# Patient Record
Sex: Female | Born: 1990 | Race: White | Hispanic: No | Marital: Married | Smoking: Never smoker
Health system: Southern US, Community
[De-identification: ages and names within clinical notes are randomized; demographics above are authoritative.]

## PROBLEM LIST (undated history)

## (undated) DIAGNOSIS — Q2381 Bicuspid aortic valve: Secondary | ICD-10-CM

## (undated) DIAGNOSIS — N83299 Other ovarian cyst, unspecified side: Secondary | ICD-10-CM

## (undated) DIAGNOSIS — F419 Anxiety disorder, unspecified: Secondary | ICD-10-CM

## (undated) DIAGNOSIS — Q231 Congenital insufficiency of aortic valve: Secondary | ICD-10-CM

## (undated) DIAGNOSIS — F32A Depression, unspecified: Secondary | ICD-10-CM

## (undated) DIAGNOSIS — J45909 Unspecified asthma, uncomplicated: Secondary | ICD-10-CM

---
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
## 2020-10-03 ENCOUNTER — Emergency Department
Admission: EM | Admit: 2020-10-03 | Discharge: 2020-10-03 | Disposition: A | Discharge status: Home / Self Care | Payer: PRIVATE HEALTH INSURANCE | Attending: Emergency Medicine | Admitting: Emergency Medicine

## 2020-10-03 ENCOUNTER — Emergency Department: Payer: PRIVATE HEALTH INSURANCE

## 2020-10-03 ENCOUNTER — Other Ambulatory Visit: Payer: Self-pay

## 2020-10-03 ENCOUNTER — Encounter: Payer: Self-pay | Admitting: Intensive Care

## 2020-10-03 DIAGNOSIS — O219 Vomiting of pregnancy, unspecified: Secondary | ICD-10-CM | POA: Diagnosis present

## 2020-10-03 DIAGNOSIS — N83299 Other ovarian cyst, unspecified side: Secondary | ICD-10-CM | POA: Diagnosis not present

## 2020-10-03 DIAGNOSIS — R000 Tachycardia, unspecified: Secondary | ICD-10-CM | POA: Insufficient documentation

## 2020-10-03 DIAGNOSIS — Z20822 Contact with and (suspected) exposure to covid-19: Secondary | ICD-10-CM | POA: Diagnosis not present

## 2020-10-03 DIAGNOSIS — R197 Diarrhea, unspecified: Secondary | ICD-10-CM

## 2020-10-03 DIAGNOSIS — Z3A01 Less than 8 weeks gestation of pregnancy: Secondary | ICD-10-CM | POA: Insufficient documentation

## 2020-10-03 DIAGNOSIS — O3481 Maternal care for other abnormalities of pelvic organs, first trimester: Secondary | ICD-10-CM | POA: Insufficient documentation

## 2020-10-03 DIAGNOSIS — N3090 Cystitis, unspecified without hematuria: Secondary | ICD-10-CM | POA: Diagnosis not present

## 2020-10-03 DIAGNOSIS — R110 Nausea: Secondary | ICD-10-CM

## 2020-10-03 DIAGNOSIS — R112 Nausea with vomiting, unspecified: Secondary | ICD-10-CM

## 2020-10-03 DIAGNOSIS — R1030 Lower abdominal pain, unspecified: Secondary | ICD-10-CM

## 2020-10-03 DIAGNOSIS — O99891 Other specified diseases and conditions complicating pregnancy: Secondary | ICD-10-CM | POA: Insufficient documentation

## 2020-10-03 DIAGNOSIS — O2311 Infections of bladder in pregnancy, first trimester: Secondary | ICD-10-CM | POA: Insufficient documentation

## 2020-10-03 DIAGNOSIS — E860 Dehydration: Secondary | ICD-10-CM

## 2020-10-03 LAB — URINALYSIS, COMPLETE (UACMP) WITH MICROSCOPIC
Bilirubin Urine: NEGATIVE
Glucose, UA: NEGATIVE mg/dL
Hgb urine dipstick: NEGATIVE
Ketones, ur: 80 mg/dL — AB
Leukocytes,Ua: NEGATIVE
Nitrite: NEGATIVE
Protein, ur: 30 mg/dL — AB
Specific Gravity, Urine: 1.03 (ref 1.005–1.030)
pH: 6 (ref 5.0–8.0)

## 2020-10-03 LAB — CBC
HCT: 38 % (ref 36.0–46.0)
Hemoglobin: 13.1 g/dL (ref 12.0–15.0)
MCH: 31 pg (ref 26.0–34.0)
MCHC: 34.5 g/dL (ref 30.0–36.0)
MCV: 90 fL (ref 80.0–100.0)
Platelets: 212 10*3/uL (ref 150–400)
RBC: 4.22 MIL/uL (ref 3.87–5.11)
RDW: 14.1 % (ref 11.5–15.5)
WBC: 7.5 10*3/uL (ref 4.0–10.5)
nRBC: 0 % (ref 0.0–0.2)

## 2020-10-03 LAB — COMPREHENSIVE METABOLIC PANEL
ALT: 14 U/L (ref 0–44)
AST: 21 U/L (ref 15–41)
Albumin: 4.5 g/dL (ref 3.5–5.0)
Alkaline Phosphatase: 27 U/L — ABNORMAL LOW (ref 38–126)
Anion gap: 10 (ref 5–15)
BUN: 15 mg/dL (ref 6–20)
CO2: 20 mmol/L — ABNORMAL LOW (ref 22–32)
Calcium: 9.4 mg/dL (ref 8.9–10.3)
Chloride: 106 mmol/L (ref 98–111)
Creatinine, Ser: 0.55 mg/dL (ref 0.44–1.00)
GFR, Estimated: >60 mL/min (ref >60)
Glucose, Bld: 135 mg/dL — ABNORMAL HIGH (ref 70–99)
Potassium: 3.9 mmol/L (ref 3.5–5.1)
Sodium: 136 mmol/L (ref 135–145)
Total Bilirubin: 1.3 mg/dL — ABNORMAL HIGH (ref 0.3–1.2)
Total Protein: 7.3 g/dL (ref 6.5–8.1)

## 2020-10-03 LAB — MAGNESIUM: Magnesium: 1.8 mg/dL (ref 1.7–2.4)

## 2020-10-03 LAB — LIPASE, BLOOD: Lipase: 27 U/L (ref 11–51)

## 2020-10-03 LAB — RESP PANEL BY RT-PCR (FLU A&B, COVID) ARPGX2
Influenza A by PCR: NEGATIVE
Influenza B by PCR: NEGATIVE
SARS Coronavirus 2 by RT PCR: NEGATIVE

## 2020-10-03 LAB — HCG, QUANTITATIVE, PREGNANCY: hCG, Beta Chain, Quant, S: 34833 m[IU]/mL — ABNORMAL HIGH (ref <5)

## 2020-10-03 IMAGING — US US OB COMP LESS 14 WK
1 series · 13 of 28 positions shown · non-contrast
Comparison: None.

CLINICAL DATA: Nausea and vomiting. Pregnant patient. Patient
states known bilateral dermoids.

EXAM:
OBSTETRIC <14 WK ULTRASOUND
TECHNIQUE: Transabdominal ultrasound was performed for evaluation of the
gestation as well as the maternal uterus and adnexal regions.

[Series 1: us ob less than 14 weeks with ob transvaginal · 67 acquisitions, 13 frames shown]
[im 3/67]
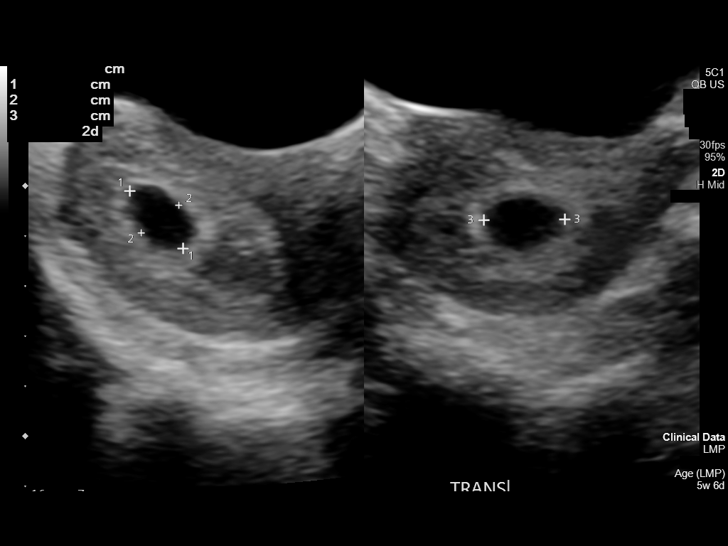
[im 8/67]
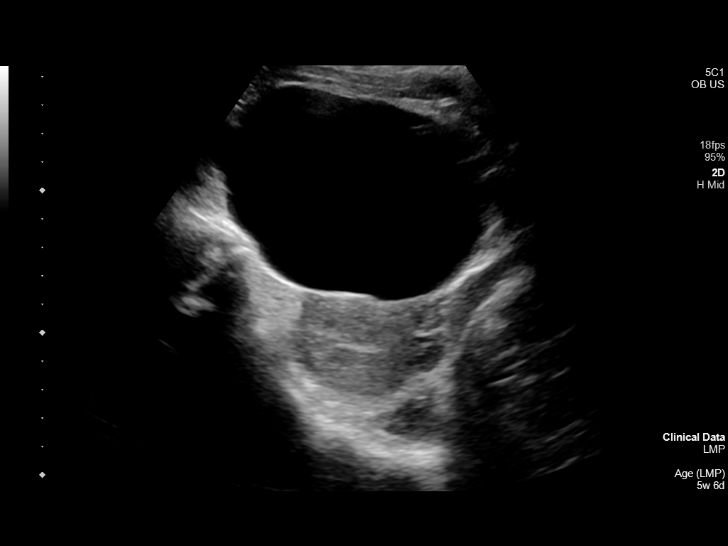
[im 13/67]
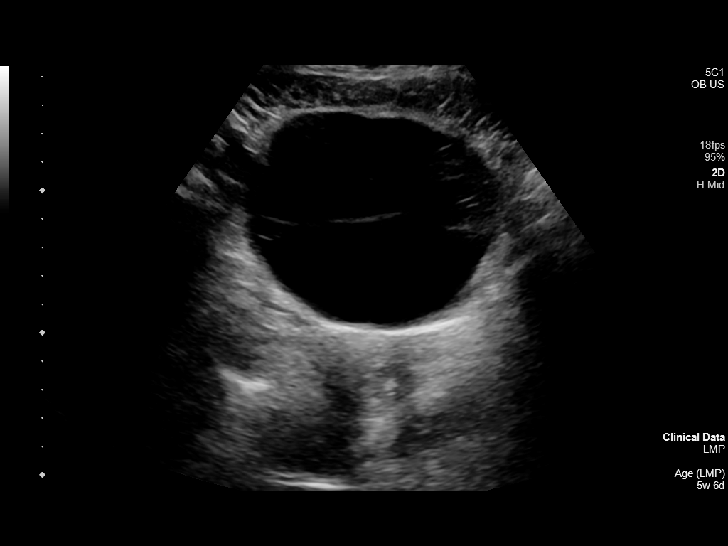
[im 18/67]
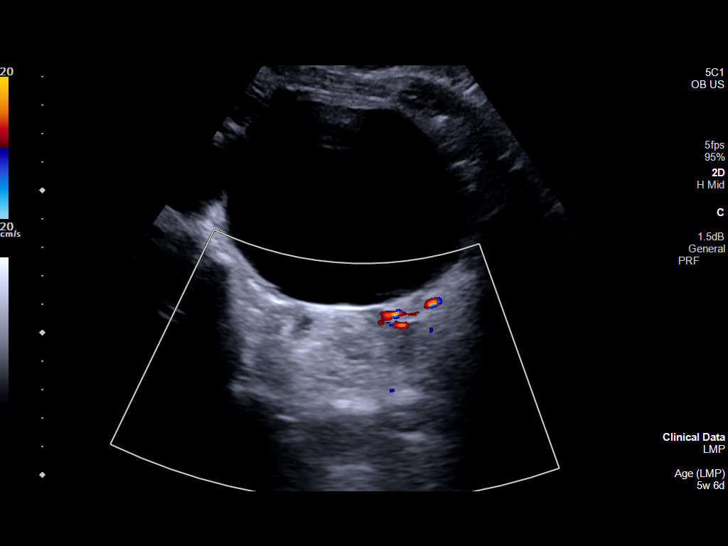
[im 23/67]
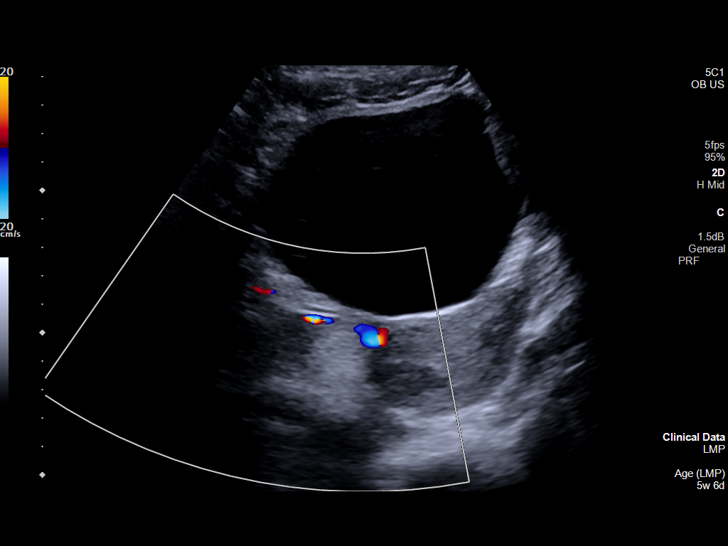
[im 27/67]
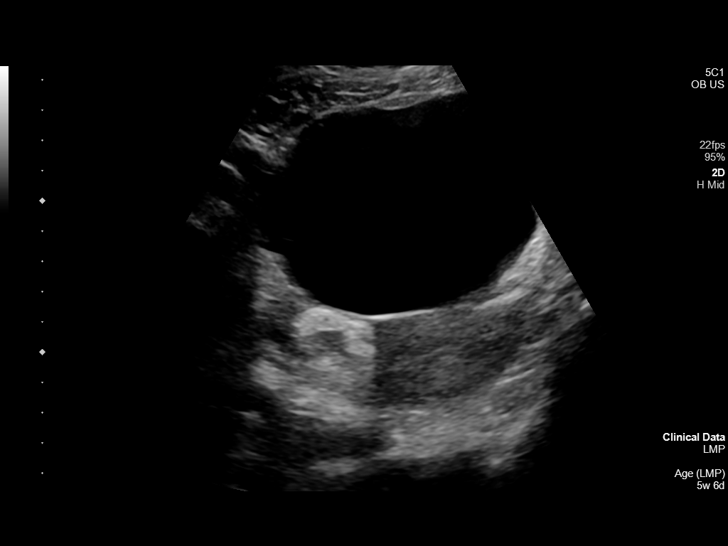
[im 35/67]
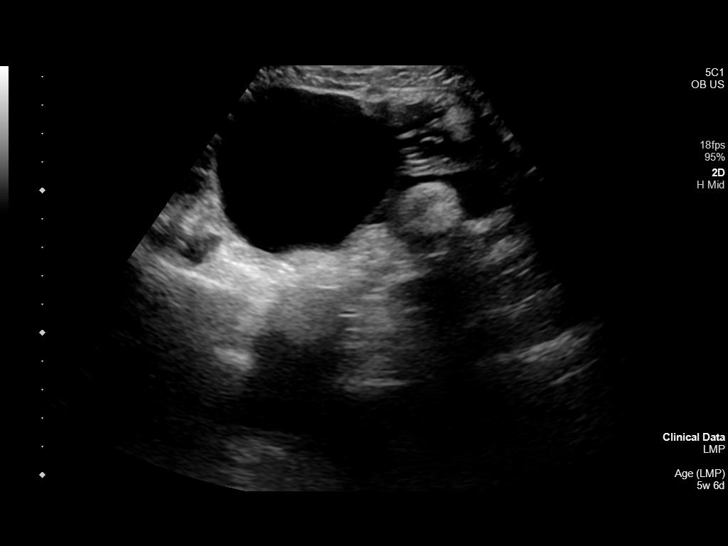
[im 40/67]
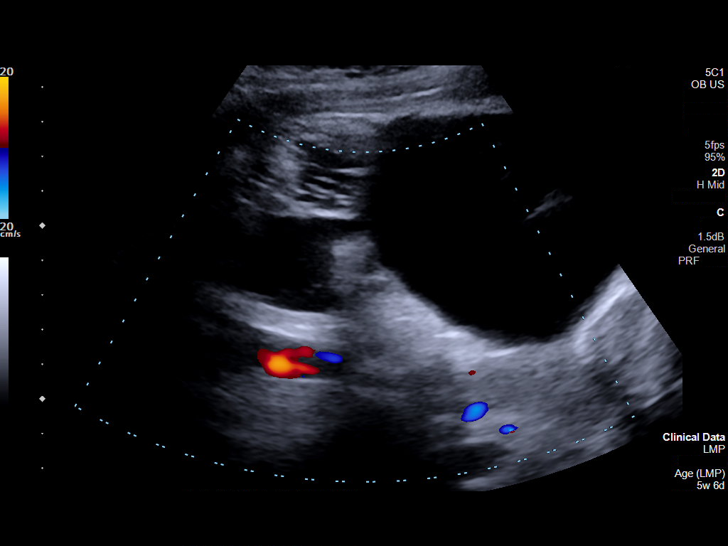
[im 45/67]
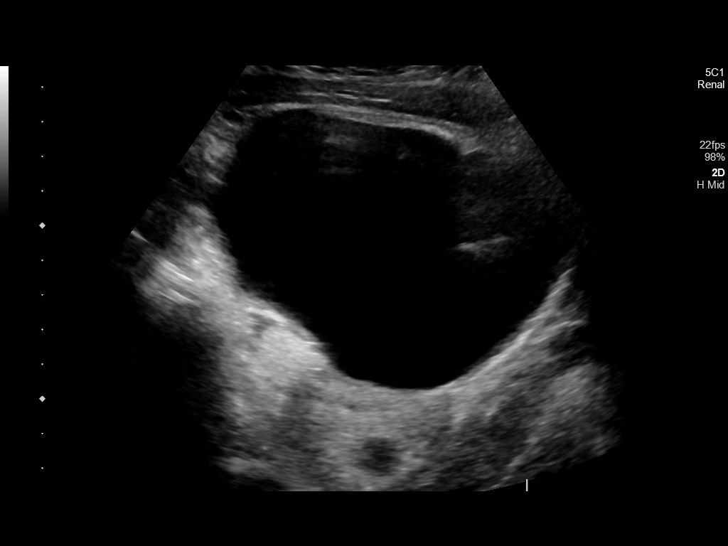
[im 49/67]
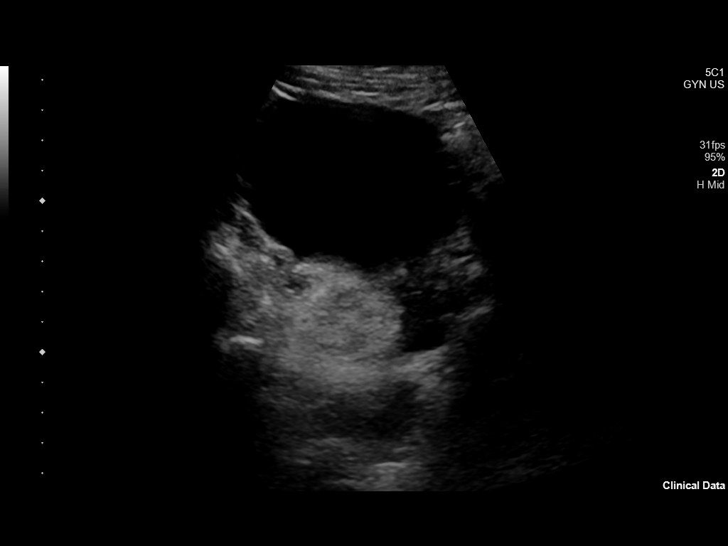
[im 54/67]
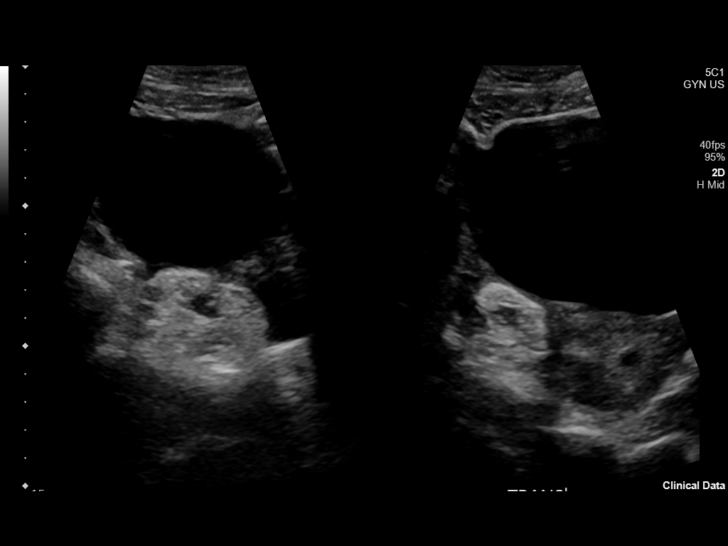
[im 59/67]
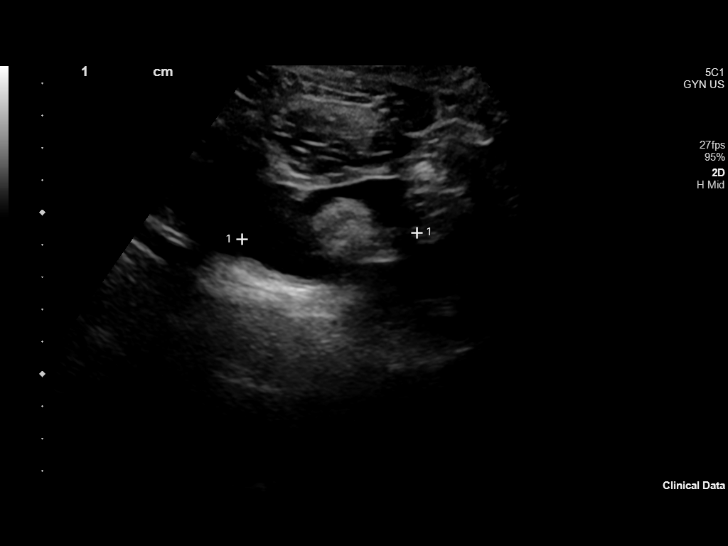
[im 64/67]
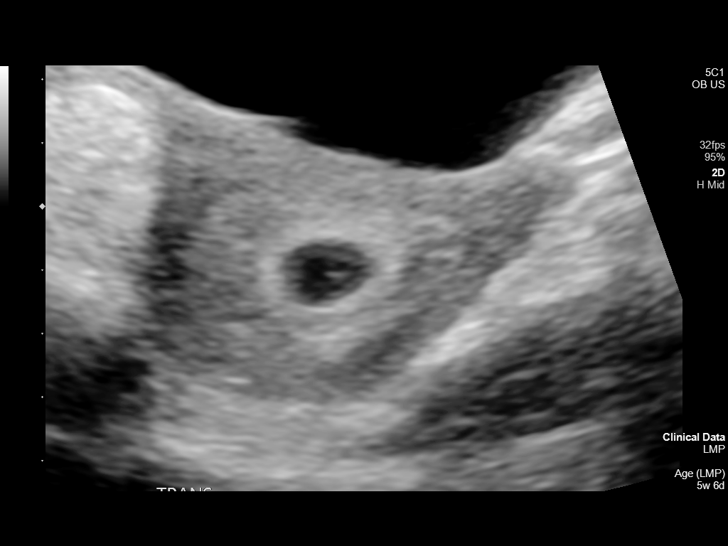

[13 of 28 positions shown; findings below may reference images not displayed]

FINDINGS: Intrauterine gestational sac: Single

Yolk sac:  Visualized.

Embryo:  Not Visualized.

Cardiac Activity: Not Visualized.

MSD:  13.7 mm   6 w   2 d

Subchorionic hemorrhage:  Small

Maternal uterus/adnexae: Bilateral complex adnexal masses. The right
adnexal mass measures 6.4 x 5.3 x 4.5 cm. The hyperechoic portion of
this mass measures 4.8 x 3.9 x 3.7 cm. The left adnexal mass
measures 7.8 x 3.4 x 5.4 cm. The hyperechoic portion measures 2.3 x
2.3 x 3.1 cm. Trace physiologic fluid in the pelvis.

Other: The patient was unable to empty or bladder with a volume of
391 cc.
IMPRESSION: 1. Intrauterine pregnancy with a gestational sac and yolk sac. No
fetal pole seen at this time.
2. Bilateral complex adnexal masses, both of which have hyperechoic
components. There are no previous studies for comparison but the
patient does state she has known bilateral dermoids which would
explain these masses.

## 2020-10-03 MED ORDER — LACTATED RINGERS IV BOLUS
2000.0000 mL | Freq: Once | INTRAVENOUS | Status: AC
  Administered 2020-10-03: 2000 mL via INTRAVENOUS

## 2020-10-03 MED ORDER — DOXYLAMINE-PYRIDOXINE 10-10 MG PO TBEC: 1.0000 | DELAYED_RELEASE_TABLET | Freq: Two times a day (BID) | ORAL | 0 refills | Status: AC | PRN

## 2020-10-03 MED ORDER — DOXYLAMINE-PYRIDOXINE 10-10 MG PO TBEC: 1.0000 | DELAYED_RELEASE_TABLET | Freq: Two times a day (BID) | ORAL | 0 refills | Status: DC | PRN

## 2020-10-03 MED ORDER — CEPHALEXIN 500 MG PO CAPS: 500.0000 mg | ORAL_CAPSULE | Freq: Four times a day (QID) | ORAL | 0 refills | Status: AC

## 2020-10-03 MED ORDER — PYRIDOXINE HCL 100 MG/ML IJ SOLN
100.0000 mg | Freq: Once | INTRAMUSCULAR | Status: AC
  Administered 2020-10-03: 100 mg via INTRAVENOUS
  Filled 2020-10-03: qty 1

## 2020-10-03 MED ORDER — DOXYLAMINE SUCCINATE (SLEEP) 25 MG PO TABS
25.0000 mg | ORAL_TABLET | ORAL | Status: AC
  Administered 2020-10-03: 25 mg via ORAL
  Filled 2020-10-03: qty 1

## 2020-10-03 NOTE — ED Notes (Signed)
Patient provided with ice water and crackers for PO challenge.

## 2020-10-03 NOTE — ED Provider Notes (Signed)
[facility] Emergency Department Provider Note  ____________________________________________   Event Date/Time   First MD Initiated Contact with Patient 10/03/20 1003     (approximate)  I have reviewed the triage vital signs and the nursing notes.   HISTORY  Chief Complaint Emesis During Pregnancy   HPI Carolyn Munoz is a 30 y.o. female G1, P0 currently 6 weeks by last LMP with has no past medical history presents for assessment of some nonbloody emesis vomiting diarrhea that started last night.  Patient states he felt little nauseous day before.  He states he has a little bit of crampy abdominal pain.  No headache, earache, sore throat, chest pain, cough, shortness of breath, back pain, rash or extremity pain.  He thinks he may have had a little burning with urination today but is not sure.  No abnormal bleeding or discharge.  No recent injuries or falls.  She is only on a prenatal.  No recent EtOH, illicit drug use, or tobacco abuse.  She has not taken anything for her symptoms and has not yet seen OB for this pregnancy.         History reviewed. No pertinent past medical history.  There are no problems to display for this patient.   History reviewed. No pertinent surgical history.  Prior to Admission medications   Medication Sig Start Date End Date Taking? Authorizing Provider  cephALEXin (KEFLEX) 500 MG capsule Take 1 capsule (500 mg total) by mouth 4 (four) times daily for 5 days. 10/03/20 10/08/20 Yes Lucrezia Starch, MD  Doxylamine-Pyridoxine (DICLEGIS) 10-10 MG TBEC Take 1 tablet by mouth 2 (two) times daily as needed for up to 10 days. 10/03/20 10/13/20  Lucrezia Starch, MD    Allergies Patient has no known allergies.  History reviewed. No pertinent family history.  Social History Social History   Tobacco Use  . Smoking status: Never Smoker  . Smokeless tobacco: Never Used  Substance Use Topics  . Alcohol use: Not Currently  . Drug use:  Never    Review of Systems  Review of Systems  Constitutional: Negative for chills and fever.  HENT: Negative for sore throat.   Eyes: Negative for pain.  Respiratory: Negative for cough and stridor.   Cardiovascular: Negative for chest pain.  Gastrointestinal: Positive for abdominal pain, diarrhea, nausea and vomiting.  Genitourinary: Positive for dysuria.  Musculoskeletal: Negative for myalgias.  Skin: Negative for rash.  Neurological: Negative for seizures, loss of consciousness and headaches.  Psychiatric/Behavioral: Negative for suicidal ideas.  All other systems reviewed and are negative.     ____________________________________________   PHYSICAL EXAM:  VITAL SIGNS: ED Triage Vitals  Enc Vitals Group     BP 10/03/20 0732 105/72     Pulse Rate 10/03/20 0732 (!) 120     Resp 10/03/20 0732 20     Temp 10/03/20 0732 98.6 F (37 C)     Temp Source 10/03/20 0732 Oral     SpO2 10/03/20 0732 99 %     Weight 10/03/20 0722 130 lb (59 kg)     Height 10/03/20 0722 5' (1.524 m)     Head Circumference --      Peak Flow --      Pain Score 10/03/20 0722 7     Pain Loc --      Pain Edu? --      Excl. in [place]? --    Vitals:   10/03/20 0732  BP: 105/72  Pulse: Marland Kitchen)  120  Resp: 20  Temp: 98.6 F (37 C)  SpO2: 99%   Physical Exam Vitals and nursing note reviewed.  Constitutional:      General: She is not in acute distress.    Appearance: She is well-developed.  HENT:     Head: Normocephalic and atraumatic.     Right Ear: External ear normal.     Left Ear: External ear normal.     Nose: Nose normal.     Mouth/Throat:     Mouth: Mucous membranes are dry.  Eyes:     Conjunctiva/sclera: Conjunctivae normal.  Cardiovascular:     Rate and Rhythm: Regular rhythm. Tachycardia present.     Heart sounds: No murmur heard.   Pulmonary:     Effort: Pulmonary effort is normal. No respiratory distress.     Breath sounds: Normal breath sounds.  Abdominal:     Palpations:  Abdomen is soft.     Tenderness: There is no abdominal tenderness. There is no right CVA tenderness or left CVA tenderness.  Musculoskeletal:     Cervical back: Neck supple.  Skin:    General: Skin is warm and dry.     Capillary Refill: Capillary refill takes 2 to 3 seconds.  Neurological:     Mental Status: She is alert and oriented to person, place, and time.  Psychiatric:        Mood and Affect: Mood normal.     Abdomen soft nontender throughout and there is no CVA tenderness. ____________________________________________   LABS (all labs ordered are listed, but only abnormal results are displayed)  Labs Reviewed  COMPREHENSIVE METABOLIC PANEL - Abnormal; Notable for the following components:      Result Value   CO2 20 (*)    Glucose, Bld 135 (*)    Alkaline Phosphatase 27 (*)    Total Bilirubin 1.3 (*)    All other components within normal limits  URINALYSIS, COMPLETE (UACMP) WITH MICROSCOPIC - Abnormal; Notable for the following components:   Color, Urine YELLOW (*)    APPearance HAZY (*)    Ketones, ur 80 (*)    Protein, ur 30 (*)    Bacteria, UA RARE (*)    All other components within normal limits  HCG, QUANTITATIVE, PREGNANCY - Abnormal; Notable for the following components:   hCG, Beta Chain, Quant, [place] 34,833 (*)    All other components within normal limits  RESP PANEL BY RT-PCR (FLU A&B, COVID) ARPGX2  URINE CULTURE  LIPASE, BLOOD  CBC  MAGNESIUM   ____________________________________________  EKG  ____________________________________________  RADIOLOGY  ED MD interpretation: Intrauterine pregnancy with gestational sac and yolk sac but no clear fetal pole seen at this time likely due to age.  Has bilateral adnexal masses consistent with patient's known history of dermoid cyst.  Official radiology report(s): US OB Comp Less 14 Wks  Result Date: 10/03/2020 CLINICAL DATA:  Nausea and vomiting. Pregnant patient. 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  EXAM: OBSTETRIC <14 WK ULTRASOUND TECHNIQUE: Transabdominal ultrasound was performed for evaluation of the gestation as well as the maternal uterus and adnexal regions. COMPARISON:  None. FINDINGS: Intrauterine gestational sac: Single Yolk sac:  Visualized. Embryo:  Not Visualized. Cardiac Activity: Not Visualized. MSD:  13.7 mm   6 w   2 d Subchorionic hemorrhage:  Small Maternal uterus/adnexae: Bilateral complex adnexal masses. The right adnexal mass measures 6.4 x 5.3 x 4.5 cm. The hyperechoic portion of this mass measures 4.8 x 3.9 x 3.7 cm. The left adnexal  mass measures 7.8 x 3.4 x 5.4 cm. The hyperechoic portion measures 2.3 x 2.3 x 3.1 cm. Trace physiologic fluid in the pelvis. Other: The patient was unable to empty or bladder with a volume of 391 cc. IMPRESSION: 1. Intrauterine pregnancy with a gestational sac and yolk sac. No fetal pole seen at this time. 2. Bilateral complex adnexal masses, both of which have hyperechoic components. There are no previous studies for comparison but the patient does state she has known bilateral dermoids which would explain these masses. Electronically Signed   By: Dorise Bullion III M.D   On: 10/03/2020 11:49    ____________________________________________   PROCEDURES  Procedure(s) performed (including Critical Care):  .1-3 Lead EKG Interpretation Performed by: Lucrezia Starch, MD Authorized by: Lucrezia Starch, MD     Interpretation: normal     ECG rate assessment: normal     Rhythm: sinus rhythm     Ectopy: none     Conduction: normal       ____________________________________________   INITIAL IMPRESSION / ASSESSMENT AND PLAN / ED COURSE      Patient presents with above-stated exam for assessment of some nonbloody nonbilious vomiting diarrhea and severe abdominal pain described above in the setting of approximately 6 weeks being pregnant.  On arrival she is tachycardic with heart rate of 120 with otherwise stable vital signs on room  air.  She endorses a crampy abdominal discomfort but her abdomen is soft nontender throughout she has no CVA tenderness.  Differential includes possible hyperemesis related to pregnancy versus gastroenteritis versus metabolic derangements.  Patient has no imaging to confirm IUP and given she reports abdominal pain will obtain ultrasound.  Low suspicion for appendicitis, torsion, PID given absence of any significant tenderness on exam of fever leukocytosis or abnormal vaginal discharge.  CMP remarkable for no significant electrolyte or metabolic derangements.  Lipase not consistent acute pancreatitis.  CBC shows no leukocytosis or acute anemia.  hCG is appropriate for expected gestational age at [id number].  Initial plan is IV fluids and antiemetics pending ultrasound.  We will also send COVID and flu  Ultrasound shows known dermoid cyst but no other acute abnormalities.  No evidence of ectopic.  Who has some bacteria which we will treat with a short course of Keflex.  Urine culture sent.  COVID and flu is negative.  Following fluids and antiemetics patient is able tolerate p.o. and feels better.  She also improvement in her heart rate.  Given stable vitals with laceration exam work-up patient tolerating p.o. I think she is safe for discharge with outpatient OB follow-up.  Rx written for Keflex and Diclegis.  Discharged stable condition.  Strict term cautions advised and discussed.      ____________________________________________   FINAL CLINICAL IMPRESSION(S) / ED DIAGNOSES  Final diagnoses:  Nausea vomiting and diarrhea  Cystitis    Medications  lactated ringers bolus 2,000 mL (2,000 mLs Intravenous New Bag/Given 10/03/20 1016)  pyridOXINE (B-6) injection 100 mg (100 mg Intravenous Given 10/03/20 1113)  doxylamine (Sleep) (UNISOM) tablet 25 mg (25 mg Oral Given 10/03/20 1112)     ED Discharge Orders         Ordered    Doxylamine-Pyridoxine (DICLEGIS) 10-10 MG TBEC  2 times daily PRN,    Status:  Discontinued        10/03/20 1107    cephALEXin (KEFLEX) 500 MG capsule  4 times daily        10/03/20 1231    Doxylamine-Pyridoxine (DICLEGIS)  10-10 MG TBEC  2 times daily PRN        10/03/20 1231           Note:  This document was prepared using Dragon voice recognition software and may include unintentional dictation errors.   Lucrezia Starch, MD 10/03/20 [phone]

## 2020-10-03 NOTE — ED Triage Notes (Signed)
Patient reports being [redacted] weeks pregnant. Nausea/vomiting started last night and unable to keep anything down. Also reports diarrhea. Denies fever. Denies discharge.

## 2020-10-05 LAB — URINE CULTURE

## 2020-11-30 LAB — OB RESULTS CONSOLE RUBELLA ANTIBODY, IGM: Rubella: IMMUNE

## 2020-11-30 LAB — OB RESULTS CONSOLE VARICELLA ZOSTER ANTIBODY, IGG: Varicella: IMMUNE

## 2020-11-30 LAB — OB RESULTS CONSOLE HEPATITIS B SURFACE ANTIGEN: Hepatitis B Surface Ag: NEGATIVE

## 2020-12-15 ENCOUNTER — Other Ambulatory Visit: Payer: Self-pay | Admitting: Obstetrics and Gynecology

## 2020-12-15 ENCOUNTER — Other Ambulatory Visit
Admission: RE | Admit: 2020-12-15 | Discharge: 2020-12-15 | Disposition: A | Discharge status: Home / Self Care | Payer: No Typology Code available for payment source | Source: Ambulatory Visit | Attending: Obstetrics and Gynecology | Admitting: Obstetrics and Gynecology

## 2020-12-15 ENCOUNTER — Other Ambulatory Visit: Payer: Self-pay

## 2020-12-15 ENCOUNTER — Other Ambulatory Visit (HOSPITAL_COMMUNITY): Payer: Self-pay | Admitting: Obstetrics and Gynecology

## 2020-12-15 ENCOUNTER — Ambulatory Visit
Admission: RE | Admit: 2020-12-15 | Discharge: 2020-12-15 | Disposition: A | Discharge status: Home / Self Care | Payer: No Typology Code available for payment source | Source: Ambulatory Visit | Attending: Obstetrics and Gynecology | Admitting: Obstetrics and Gynecology

## 2020-12-15 ENCOUNTER — Encounter
Admission: RE | Admit: 2020-12-15 | Discharge: 2020-12-15 | Disposition: A | Discharge status: Home / Self Care | Payer: No Typology Code available for payment source | Source: Ambulatory Visit | Attending: Obstetrics and Gynecology | Admitting: Obstetrics and Gynecology

## 2020-12-15 DIAGNOSIS — N9489 Other specified conditions associated with female genital organs and menstrual cycle: Secondary | ICD-10-CM

## 2020-12-15 DIAGNOSIS — Z20822 Contact with and (suspected) exposure to covid-19: Secondary | ICD-10-CM | POA: Insufficient documentation

## 2020-12-15 DIAGNOSIS — Z01812 Encounter for preprocedural laboratory examination: Secondary | ICD-10-CM | POA: Insufficient documentation

## 2020-12-15 HISTORY — DX: Unspecified asthma, uncomplicated: J45.909

## 2020-12-15 HISTORY — DX: Anxiety disorder, unspecified: F41.9

## 2020-12-15 HISTORY — DX: Bicuspid aortic valve: Q23.81

## 2020-12-15 HISTORY — DX: Congenital insufficiency of aortic valve: Q23.1

## 2020-12-15 HISTORY — DX: Other ovarian cyst, unspecified side: N83.299

## 2020-12-15 HISTORY — DX: Depression, unspecified: F32.A

## 2020-12-15 LAB — BASIC METABOLIC PANEL
Anion gap: 6 (ref 5–15)
BUN: 11 mg/dL (ref 6–20)
CO2: 23 mmol/L (ref 22–32)
Calcium: 9.4 mg/dL (ref 8.9–10.3)
Chloride: 105 mmol/L (ref 98–111)
Creatinine, Ser: <0.3 mg/dL — ABNORMAL LOW (ref 0.44–1.00)
Glucose, Bld: 91 mg/dL (ref 70–99)
Potassium: 3.8 mmol/L (ref 3.5–5.1)
Sodium: 134 mmol/L — ABNORMAL LOW (ref 135–145)

## 2020-12-15 LAB — CBC
HCT: 32.7 % — ABNORMAL LOW (ref 36.0–46.0)
Hemoglobin: 11.2 g/dL — ABNORMAL LOW (ref 12.0–15.0)
MCH: 31.8 pg (ref 26.0–34.0)
MCHC: 34.3 g/dL (ref 30.0–36.0)
MCV: 92.9 fL (ref 80.0–100.0)
Platelets: 199 10*3/uL (ref 150–400)
RBC: 3.52 MIL/uL — ABNORMAL LOW (ref 3.87–5.11)
RDW: 14.1 % (ref 11.5–15.5)
WBC: 7.5 10*3/uL (ref 4.0–10.5)
nRBC: 0 % (ref 0.0–0.2)

## 2020-12-15 LAB — TYPE AND SCREEN
ABO/RH(D): A POS
Antibody Screen: NEGATIVE
Extend sample reason: UNDETERMINED

## 2020-12-15 LAB — SARS CORONAVIRUS 2 (TAT 6-24 HRS): SARS Coronavirus 2: NEGATIVE

## 2020-12-15 IMAGING — MR MR PELVIS W/O CM
10 of 13 series · 29 of 48 positions shown · non-contrast
Comparison: Comparison with reports from the current nodal clinic
from [DATE].

CLINICAL DATA: Sixteen weeks pregnant with LEFT-sided pressure and
abnormal sonogram showing large "cysts"

EXAM:
MRI PELVIS WITHOUT CONTRAST
TECHNIQUE: Multiplanar multisequence MR imaging of the pelvis was performed. No
intravenous contrast was administered.

[Series 2: T2 · coronal · 5.0mm · 1.56mm/px · 1 of 30 slices shown (1 of 7)]
[im 1/30]
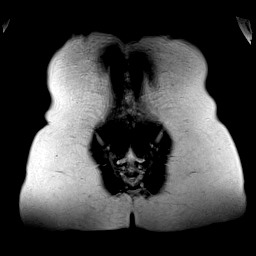

[Series 3: T2 · sagittal · 5.0mm · 0.94mm/px · 2 of 40 slices shown (2 of 7)]
[im 1/40]
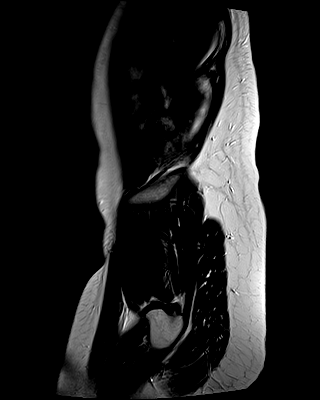
[im 40/40]
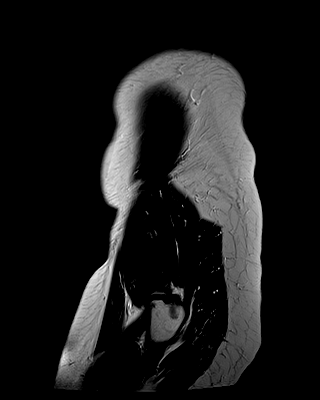

[Series 4: T2 · axial · 6.0mm · 0.59mm/px · z∈[-155,+162]mm · 3 of 45 slices shown (3 of 7)]
[im 1/45]
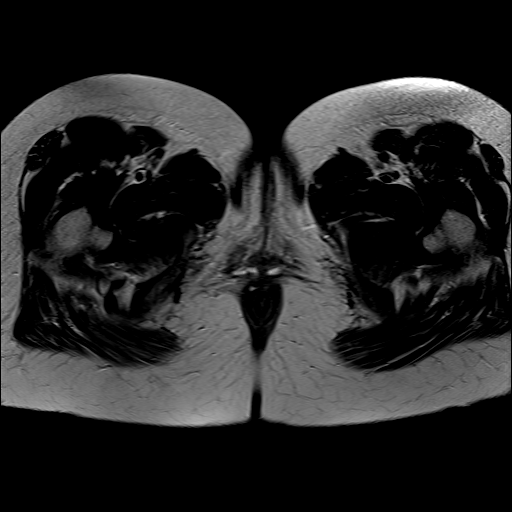
[im 23/45]
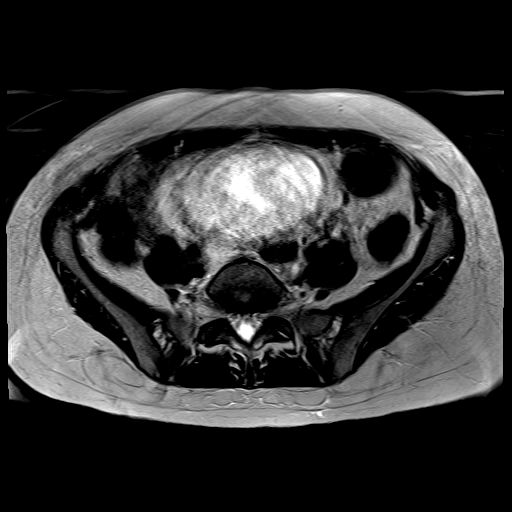
[im 45/45]
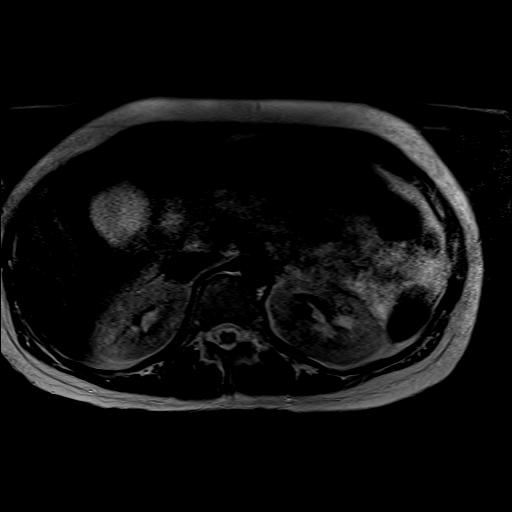

[Series 6: T2 · axial · 6.0mm · 0.59mm/px · z∈[-155,+162]mm · 3 of 45 slices shown (4 of 7)]
[im 1/45]
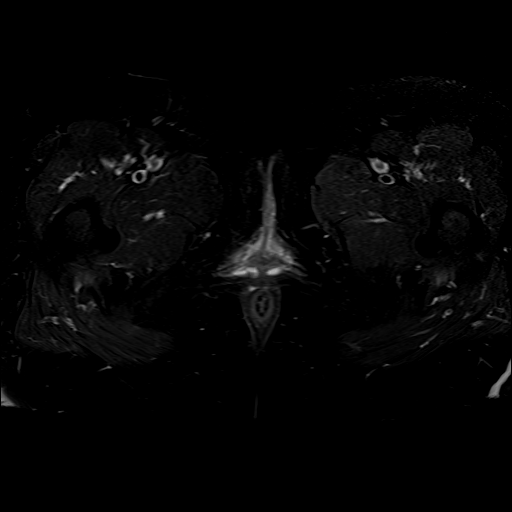
[im 23/45]
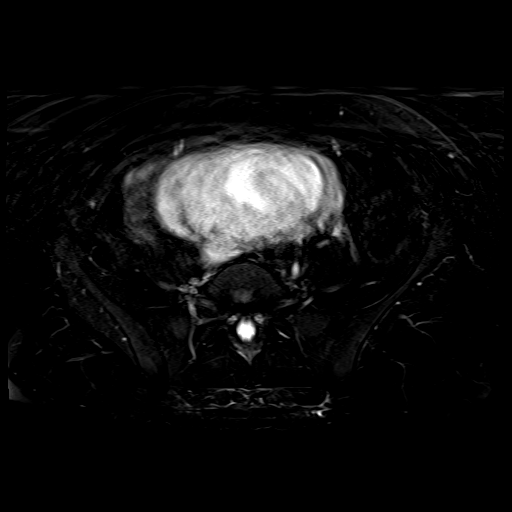
[im 45/45]
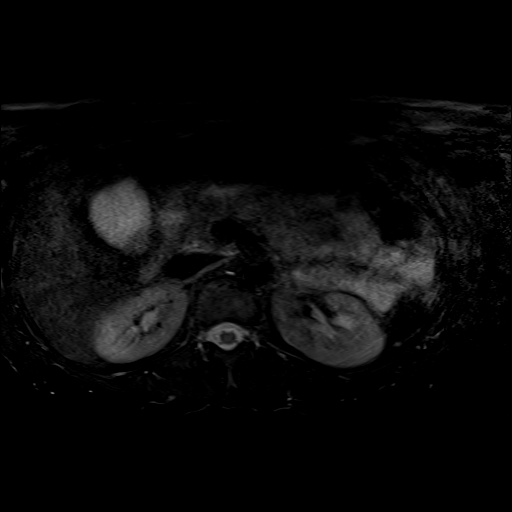

[Series 7: T2 · coronal · 5.0mm · 1.25mm/px · 2 of 33 slices shown (5 of 7)]
[im 1/33]
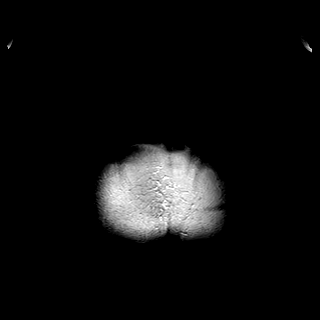
[im 33/33]
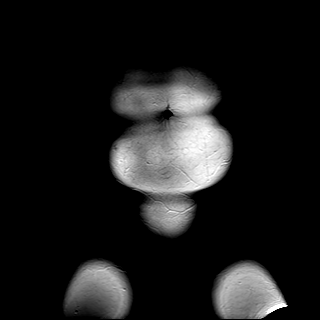

[Series 8: ax dwi_tracew · axial · 6.0mm · 1.42mm/px · z∈[-158,+166]mm · 8 of 138 slices shown]
[im 1/138]
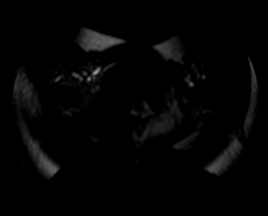
[im 20/138]
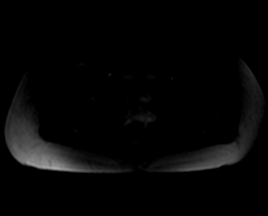
[im 40/138]
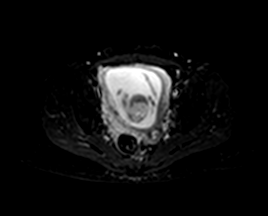
[im 59/138]
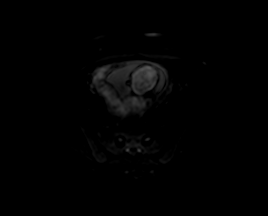
[im 79/138]
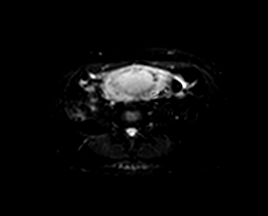
[im 98/138]
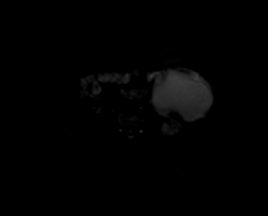
[im 118/138]
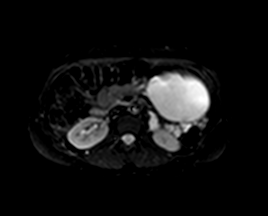
[im 138/138]
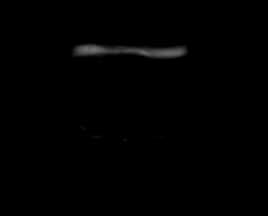

[Series 9: ax dwi_adc · axial · 6.0mm · 1.42mm/px · z∈[-158,+166]mm · 3 of 46 slices shown]
[im 1/46]
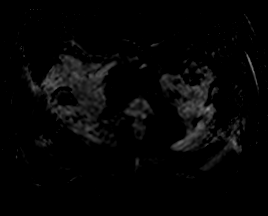
[im 23/46]
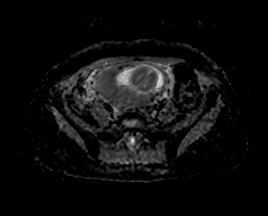
[im 46/46]
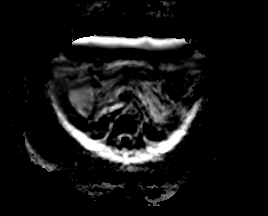

[Series 14: T2 · sagittal · 5.0mm · 0.94mm/px · 2 of 40 slices shown (6 of 7)]
[im 1/40]
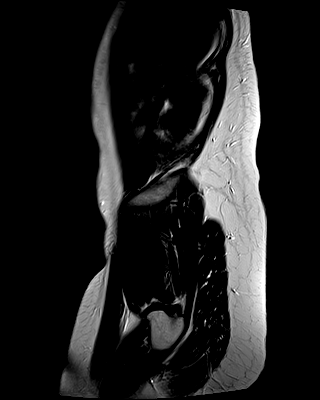
[im 40/40]
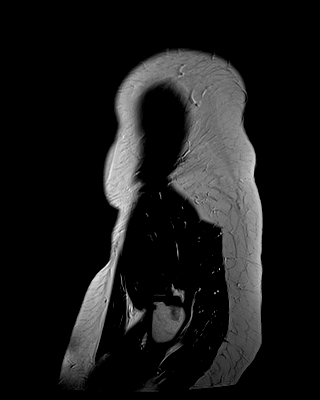

[Series 15: T2 · axial · 6.0mm · 0.66mm/px · z∈[-155,+162]mm · 3 of 45 slices shown (7 of 7)]
[im 1/45]
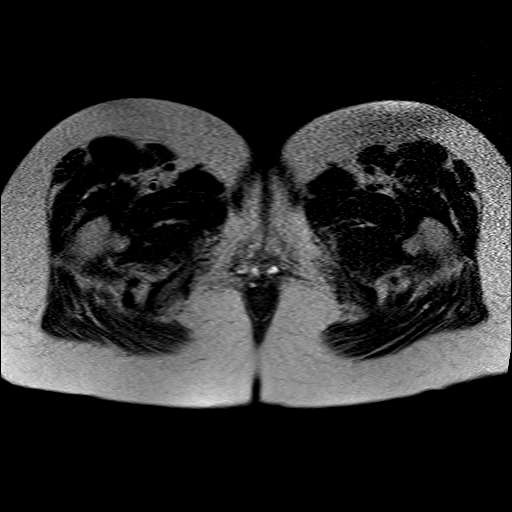
[im 23/45]
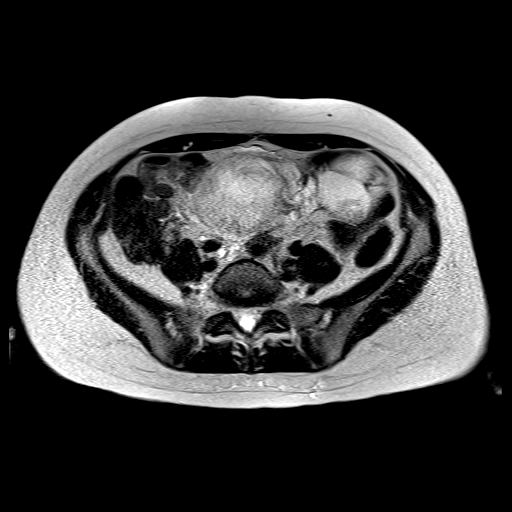
[im 45/45]
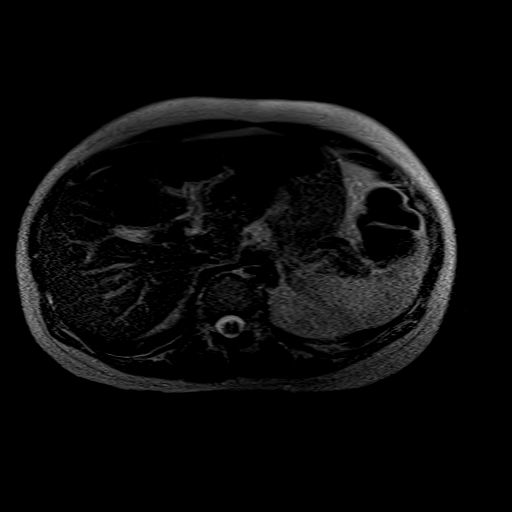

[Series 16: T2 fat-sat · axial · 6.0mm · 0.66mm/px · z∈[-155,+4]mm · 2 of 45 slices shown]
[im 1/45]
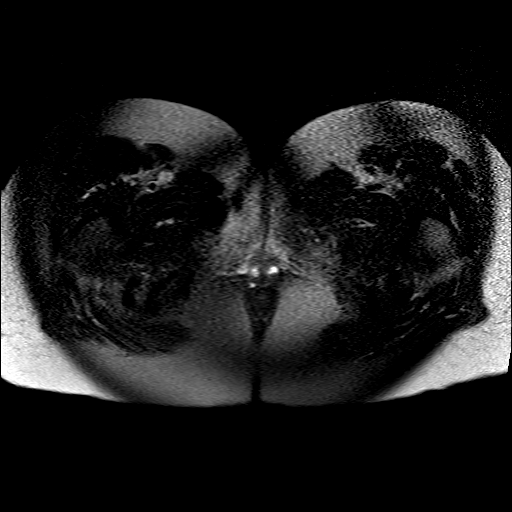
[im 23/45]
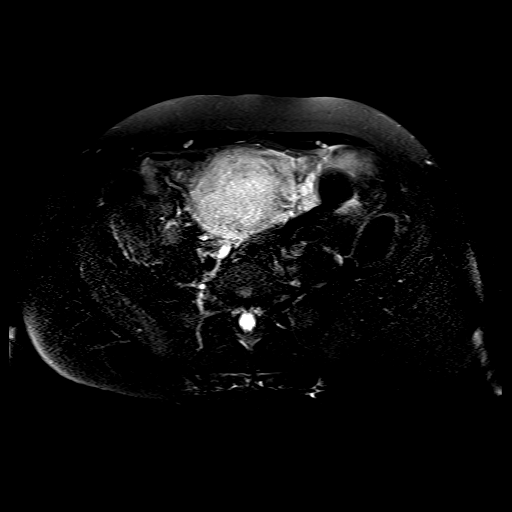

[29 of 48 positions shown; findings below may reference images not displayed]

FINDINGS: Urinary Tract: Urinary bladder is collapsed, compressed by gravid
uterus. No signs of hydronephrosis on incidentally imaged portions
of the kidneys. No distal ureteral dilation.

Bowel: Bowel displaced by cystic mass in the LEFT hemiabdomen. No
acute bowel process to the extent evaluated by pelvic MRI.

Vascular/Lymphatic: Normal caliber abdominal and pelvic vessels,
lower abdominal vessels are only partially imaged. No signs of
adenopathy in the abdomen or in the pelvis.

Reproductive: Gravid uterus with single intrauterine gestation
currently in breech presentation. Fetus not evaluated due to motion
and study was not performed for fetal evaluation. Some distortion of
uterine contour is favored to represent myometrial contractions.

RIGHT ovary: 4.2 x 3.1 cm in axial dimension approximately 5.1 cm
greatest craniocaudal dimension. Lesion with macroscopic fat and
mildly heterogeneous signal compatible with ovarian dermoid measures
3.0 x 2.6 x 3.6 cm. No overtly cystic appearance of the RIGHT ovary
or adnexa, potentially resolved since previous imaging in terms of
cystic component.

LEFT ovary/adnexa: In the LEFT adnexa is a larger more complex
appearing lesion with both cystic and fatty elements. Mixed T1
signal is present inferior to a cystic area which is homogeneously
T2 bright and homogeneously T1 dark measuring 8 x 10 x 7.9 cm.
Assessment is limited due to motion related artifact in the mid
abdomen. This extends to the level of the mid abdomen and is
associated with a complex appearing mixed signal area showing both
microscopic and macroscopic fatty elements much of this showing
signs of macroscopic fat.

In the axial plane the largest area measuring 5.0 x 5.4 cm. The next
largest area just posterior and superior to this along the wall of
the cystic area described above measuring 3.5 x 2.8 cm. In total
fatty component in the same plane measuring up to 7-1/2 cm greatest
dimension.

No free fluid adjacent to this area or signs of significant edema
though motion does limit assessment.

Other:  Small volume fluid in the pelvis.

Musculoskeletal: No suspicious bone lesions identified.
IMPRESSION: 1. Bilateral ovarian dermoids with with large dermoid on the LEFT
showing enlargement of cystic component suspected based on previous
ultrasound reports, near 10 cm greatest dimension. No significant
surrounding stranding. Correlate with any acute abdominal symptoms.
Based on the size of the LEFT sided dermoid in particular the
patient would be at risk for ovarian torsion.
2. Smaller dermoid on the RIGHT measuring approximately 3.0 x 2.6 x
3.6 cm without overtly cystic features, mainly fatty process in the
RIGHT ovary.
3. Comparison with more remote prior cross-sectional imaging if
available may be helpful. Current imaging reviewed with reports from
[REDACTED], [REDACTED] and ultrasound evaluation from
[DATE].
4. No significant cystic component on the RIGHT.
5. Gravid uterus with single intrauterine gestation currently in
breech presentation. Fetus not evaluated due to motion and study was
not performed for fetal evaluation, signs of myometrial contractions
during the examination.
6. Small volume fluid in the pelvis.

These results were called by telephone at the time of interpretation
on [DATE] at [DATE] to provider TRIPTI , who verbally
acknowledged these results.

## 2020-12-15 NOTE — Patient Instructions (Addendum)
Your procedure is scheduled on: Thursday, July 14 Report to the Registration Desk on the 1st floor of the [facility]. To find out your arrival time, please call [phone] between 1PM - 3PM on: Wednesday, July 13  REMEMBER: Instructions that are not followed completely may result in serious medical risk, up to and including death; or upon the discretion of your surgeon and anesthesiologist your surgery may need to be rescheduled.  Do not eat food after midnight the night before surgery.  No gum chewing, lozengers or hard candies.  You may however, drink CLEAR liquids up to 2 hours before you are scheduled to arrive for your surgery. Do not drink anything within 2 hours of your scheduled arrival time.  Clear liquids include: - water  - apple juice without pulp - gatorade (not RED, PURPLE, OR BLUE) - black coffee or tea (Do NOT add milk or creamers to the coffee or tea) Do NOT drink anything that is not on this list.  DO NOT TAKE ANY MEDICATIONS THE MORNING OF SURGERY   One week prior to surgery: Stop Anti-inflammatories (NSAIDS) such as Advil, Aleve, Ibuprofen, Motrin, Naproxen, Naprosyn and Aspirin based products such as Excedrin, Goodys Powder, BC Powder. Stop ANY OVER THE COUNTER supplements until after surgery. You may however, continue to take Tylenol if needed for pain up until the day of surgery.  No Alcohol for 24 hours before or after surgery.  No Smoking including e-cigarettes for 24 hours prior to surgery.  No chewable tobacco products for at least 6 hours prior to surgery.  No nicotine patches on the day of surgery.  Do not use any "recreational" drugs for at least a week prior to your surgery.  Please be advised that the combination of cocaine and anesthesia may have negative outcomes, up to and including death. If you test positive for cocaine, your surgery will be cancelled.  On the morning of surgery brush your teeth with toothpaste and water, you may rinse  your mouth with mouthwash if you wish. Do not swallow any toothpaste or mouthwash.  Do not wear jewelry, make-up, hairpins, clips or nail polish.  Do not wear lotions, powders, or perfumes.   Do not shave body from the neck down 48 hours prior to surgery just in case you cut yourself which could leave a site for infection.  Also, freshly shaved skin may become irritated if using the CHG soap.  Contact lenses, hearing aids and dentures may not be worn into surgery.  Do not bring valuables to the hospital. [facility] is not responsible for any missing/lost belongings or valuables.   Use CHG Soap as directed on instruction sheet.  Notify your doctor if there is any change in your medical condition (cold, fever, infection).  Wear comfortable clothing (specific to your surgery type) to the hospital.  After surgery, you can help prevent lung complications by doing breathing exercises.  Take deep breaths and cough every 1-2 hours. Your doctor may order a device called an Incentive Spirometer to help you take deep breaths. When coughing or sneezing, hold a pillow firmly against your incision with both hands. This is called "splinting." Doing this helps protect your incision. It also decreases belly discomfort.  If you are being admitted to the hospital overnight, leave your suitcase in the car. After surgery it may be brought to your room.  If you are being discharged the day of surgery, you will not be allowed to drive home. You will need a  responsible adult (18 years or older) to drive you home and stay with you that night.   If you are taking public transportation, you will need to have a responsible adult (18 years or older) with you. Please confirm with your physician that it is acceptable to use public transportation.   Please call the [facility] at [phone] if you have any questions about these instructions.  Surgery Visitation Policy:  Patients undergoing  a surgery or procedure may have one family member or support person with them as long as that person is not COVID-19 positive or experiencing its symptoms.  That person may remain in the waiting area during the procedure.  Inpatient Visitation:    Visiting hours are 7 a.m. to 8 p.m. Inpatients will be allowed two visitors daily. The visitors may change each day during the patient's stay. No visitors under the age of 50. Any visitor under the age of 66 must be accompanied by an adult. The visitor must pass COVID-19 screenings, use hand sanitizer when entering and exiting the patient's room and wear a mask at all times, including in the patient's room. Patients must also wear a mask when staff or their visitor are in the room. Masking is required regardless of vaccination status.

## 2020-12-16 ENCOUNTER — Other Ambulatory Visit: Payer: PRIVATE HEALTH INSURANCE

## 2020-12-16 NOTE — H&P (Signed)
Carolyn Munoz is a 30 y.o. female presenting with consideration for Pre Op Consulting (Sign consents) on 12/14/2020   History of Present Illness: Pt presents for discussion of surgical management of her complex ovarian cysts. She is currently [redacted]w[redacted]d pregnant. She has a bicuspid aortic valve and is followed by Cardiology.    At her last visit, we obtained an ultrasound. She previously had a TVUS at [facility] which found possible dermoids, with a midline simple mass that they called her 341ml full bladder, but on our imaging appears to be a 10cm cystic mass - see below.   MRI on 12/15/20: IMPRESSION: 1. 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 No significant surrounding stranding. Correlate with any acute abdominal symptoms. Based on the size of the LEFT sided dermoid in particular the patient would be at risk for ovarian torsion. 2. Smaller dermoid on the RIGHT measuring approximately 3.0 x 2.6 x 3.6 cm without overtly cystic features, mainly fatty process in the RIGHT ovary. 3. Comparison with more remote prior cross-sectional imaging if available may be helpful. Current imaging reviewed with reports from [facility], [place] [facility] and ultrasound evaluation from October 03, 2020. 4. No significant cystic component on the RIGHT. 5. Gravid uterus with single intrauterine gestation currently in breech presentation. Fetus not evaluated due to motion and study was not performed for fetal evaluation, signs of myometrial contractions during the examination. 6. Small volume fluid in the pelvis.    She was cleared for surgery by Dr. Serafina Royals on 12/10/2020  Proceed to surgery and/or invasive procedure without restriction to pre or post operative and/or procedural care.  The patient is at lowest risk possible for cardiovascular complications with surgical  intervention and/or invasive procedure.  Currently has no evidence active and/or significant angina and/or congestive heart failure.   Workup:  TVUS: 11/27/20  Single, viable IUP, S=[redacted]w[redacted]d FHR= 158bpm Cervical length=5.44cm   Rt ovary contains 2 cysts:1)complex hyperechoic=4.2cm    2)corpus luteum=2.5cm Midline(superior to IUP)complex septated adnexal mass=10.35 x 6.47 x 10.90cm; septations=0.43cm, 0.22cm, 0.40cm; hyperechoic portion(lateral edge)=3.04 x 1.06 x 1.25cm Left adnexa contains 2 complex masses:1)3.82 x 2.09 x 3.46cm    2)6.49 x 5.27 x 6.57cm; hyperechoic portion=2.53 x 2.28 x 2.26cm No free fluid seen     TVUS: 10/14/2020 Uterus anteverted Single, viable IUP, S=[redacted]w[redacted]d Yolk sac and amnion seen FHR= 160bpm Cervical length=4.89cm Rt corpus luteum cyst=2.5cm Rt (midline slightly rt) complex septated adnexal mass=10.82 x 7.50 x 9.30cm; septations=0.64cm,0.27cm, 0.57cm; hyperechoic portion=4.45 x 2.16 x 2.08cm Left adnexa contains 2 complex masses:1)3.60 x 2.06 x 3.80cm     2)6.29 x 5.12 x 6.18cm; hyperechoic portion=2.47 x 1.97 x 2.40cm No free fluid seen   TVUS Performed at [facility] on 10/03/2020 Intrauterine gestational sac: Single Yolk sac:  Visualized.  Embryo:  Not Visualized.  Cardiac Activity: Not Visualized.  MSD:  13.7 mm   6 w   2 d  Subchorionic hemorrhage:  Small   Maternal uterus/adnexae: Bilateral complex adnexal masses. The right  adnexal mass measures 6.4 x 5.3 x 4.5 cm. The hyperechoic portion of  this mass measures 4.8 x 3.9 x 3.7 cm. The left adnexal mass  measures 7.8 x 3.4 x 5.4 cm. The hyperechoic portion measures 2.3 x  2.3 x 3.1 cm. Trace physiologic fluid in the pelvis.   Other: The patient was unable to empty or bladder with a volume of  391 cc.   IMPRESSION:  1. Intrauterine pregnancy with a gestational sac and yolk sac. No  fetal pole seen at this time.  2. Bilateral complex adnexal masses, both of which have hyperechoic  components. There are no  previous studies for comparison but the  patient does state she has known bilateral dermoids which would  explain these masses.   Past Medical History:  has a past medical history of Allergy, Anxiety, Asthma, unspecified asthma severity, unspecified whether complicated, unspecified whether persistent, Bicuspid aortic valve, Chronic mental illness, Cyst, ovary, dermoid, and Depression.  Past Surgical History:  has a past surgical history that includes Appendectomy. Family History: family history includes Allergic rhinitis in her father and sister; Allergies in her mother and paternal grandfather; Alzheimer's disease in her maternal grandfather; Asthma in her father and sister; Breast cancer (age of onset: 48) in her mother; Breast cancer (age of onset: 56) in her maternal grandmother and paternal grandmother; Colon cancer in her paternal grandfather; Depression in her father, mother, and sister; Diabetes type II in her maternal grandmother and paternal grandmother; Leukemia in her paternal grandmother; Valvular heart disease in her mother. Social History:  reports that she has never smoked. She has never used smokeless tobacco. She reports that she does not drink alcohol and does not use drugs. OB/GYN History:          OB History     Gravida  1   Para      Term      Preterm      AB      Living         SAB      IAB      Ectopic      Molar      Multiple      Live Births              Allergies: is allergic to banana. Medications:   Current Outpatient Medications:    acetaminophen (TYLENOL) 500 MG tablet, Take by mouth, Disp: , Rfl:    diphenhydramine HCl (UNISOM, DIPHENHYDRAMINE, ORAL), Take 1 capsule by mouth as needed (nausea), Disp: , Rfl:    doxylamine succinate (UNISOM) 25 mg tablet, Take by mouth, Disp: , Rfl:    prenatal vitamin with iron-folic acid (PRENATAL TABLETS) tablet, Take 1 tablet by mouth once daily, Disp: , Rfl:    pyridoxine, vitamin B6, (VITAMIN B-6) 50  MG tablet, Take 50 mg by mouth 2 (two) times daily, Disp: , Rfl:    Review of Systems: No SOB, no palpitations or chest pain, no new lower extremity edema, no nausea or vomiting or bowel or bladder complaints. See HPI for gyn specific ROS.    Exam:    BP 97/64   Pulse 83   Ht 149.9 cm (4\' 11" )   Wt 59.5 kg (131 lb 3.2 oz)   LMP 08/23/2020 (Exact Date)   BMI 26.50 kg/m    General: Patient is well-groomed, well-nourished, appears stated age in no acute distress   HEENT: head is atraumatic and normocephalic, trachea is midline, neck is supple with no palpable nodules   CV: Regular rhythm and normal heart rate, no murmur   Pulm: Clear to auscultation throughout lung fields with no wheezing, crackles, or rhonchi. No increased work of breathing   Abdomen: soft , no mass, non-tender, no rebound tenderness, no hepatomegaly    Impression:    The encounter diagnosis was Adnexal cyst.   Plan:    1. Complex  bilateral adnexal cysts, large Dermoid cysts with 10cm of cystic portion  -Patient returns for a preoperative discussion regarding her plans to proceed with surgical treatment of her bilateral large dermoid cysts by mini-lap vs laparotomy with pelvic washings procedure.     The patient and I discussed the technical aspects of the procedure including the potential for risks and complications.  These include but are not limited to the risk of infection requiring post-operative antibiotics or further procedures.  We talked about the risk of injury to adjacent organs including bladder, bowel, ureter, blood vessels or nerves.  We talked about the need to convert to an open incision.  We talked about the possible need for blood transfusion.  We talked about postop complications such as thromboembolic or cardiopulmonary complications, or pregnancy complications, though these are extremely unlikely.  All of her questions were answered.  Her preoperative exam was completed and the appropriate  consents were signed. She is scheduled to undergo this procedure in 72 hours. We will check fetal heart tones in the postop area.   Specific Peri-operative Considerations:  - Consent: obtained today - Health Maintenance: uptodate- s/p cardiology - Labs: CBC, CMP preoperatively - Studies: EKG, CXR preoperatively - Bowel Preparation: None required - Abx:  Cefoxitin 2g - VTE ppx: SCDs perioperatively

## 2020-12-17 ENCOUNTER — Inpatient Hospital Stay
Admission: RE | Admit: 2020-12-17 | Discharge: 2020-12-19 | DRG: 819 | Disposition: A | Discharge status: Home / Self Care | Payer: PRIVATE HEALTH INSURANCE | Attending: Obstetrics and Gynecology | Admitting: Obstetrics and Gynecology

## 2020-12-17 ENCOUNTER — Observation Stay: Payer: PRIVATE HEALTH INSURANCE | Admitting: Urgent Care

## 2020-12-17 ENCOUNTER — Encounter
Admission: RE | Disposition: A | Discharge status: Home / Self Care | Payer: Self-pay | Source: Home / Self Care | Attending: Obstetrics and Gynecology

## 2020-12-17 ENCOUNTER — Other Ambulatory Visit: Payer: Self-pay

## 2020-12-17 ENCOUNTER — Encounter: Payer: Self-pay | Admitting: Obstetrics and Gynecology

## 2020-12-17 DIAGNOSIS — O99891 Other specified diseases and conditions complicating pregnancy: Principal | ICD-10-CM | POA: Diagnosis present

## 2020-12-17 DIAGNOSIS — D270 Benign neoplasm of right ovary: Secondary | ICD-10-CM | POA: Diagnosis present

## 2020-12-17 DIAGNOSIS — D271 Benign neoplasm of left ovary: Secondary | ICD-10-CM | POA: Diagnosis present

## 2020-12-17 DIAGNOSIS — Z3A16 16 weeks gestation of pregnancy: Secondary | ICD-10-CM

## 2020-12-17 DIAGNOSIS — N83202 Unspecified ovarian cyst, left side: Secondary | ICD-10-CM | POA: Diagnosis present

## 2020-12-17 DIAGNOSIS — N83201 Unspecified ovarian cyst, right side: Secondary | ICD-10-CM | POA: Diagnosis present

## 2020-12-17 HISTORY — PX: OVARIAN CYST REMOVAL: SHX89

## 2020-12-17 LAB — ABO/RH: ABO/RH(D): A POS

## 2020-12-17 SURGERY — EXCISION, CYST, OVARY
Anesthesia: General | Laterality: Bilateral

## 2020-12-17 MED ORDER — 0.9 % SODIUM CHLORIDE (POUR BTL) OPTIME
TOPICAL | Status: DC | PRN
  Administered 2020-12-17: 500 mL

## 2020-12-17 MED ORDER — FENTANYL CITRATE (PF) 100 MCG/2ML IJ SOLN
INTRAMUSCULAR | Status: AC
  Filled 2020-12-17: qty 2

## 2020-12-17 MED ORDER — ATROPINE SULFATE 0.4 MG/ML IJ SOLN
INTRAMUSCULAR | Status: AC
  Filled 2020-12-17: qty 1

## 2020-12-17 MED ORDER — IBUPROFEN 600 MG PO TABS
600.0000 mg | ORAL_TABLET | Freq: Four times a day (QID) | ORAL | Status: DC
  Administered 2020-12-18 – 2020-12-19 (×4): 600 mg via ORAL
  Filled 2020-12-17 (×5): qty 1

## 2020-12-17 MED ORDER — PHENYLEPHRINE HCL (PRESSORS) 10 MG/ML IV SOLN
INTRAVENOUS | Status: AC
  Filled 2020-12-17: qty 1

## 2020-12-17 MED ORDER — KETOROLAC TROMETHAMINE 30 MG/ML IJ SOLN
30.0000 mg | Freq: Four times a day (QID) | INTRAMUSCULAR | Status: AC
  Administered 2020-12-17 – 2020-12-18 (×4): 30 mg via INTRAVENOUS
  Filled 2020-12-17 (×4): qty 1

## 2020-12-17 MED ORDER — PHENYLEPHRINE HCL (PRESSORS) 10 MG/ML IV SOLN
INTRAVENOUS | Status: DC | PRN
  Administered 2020-12-17: 100 ug via INTRAVENOUS
  Administered 2020-12-17 (×5): 50 ug via INTRAVENOUS

## 2020-12-17 MED ORDER — ONDANSETRON HCL 4 MG/2ML IJ SOLN
4.0000 mg | Freq: Three times a day (TID) | INTRAMUSCULAR | Status: DC | PRN
  Administered 2020-12-17 – 2020-12-18 (×2): 4 mg via INTRAVENOUS
  Filled 2020-12-17 (×2): qty 2

## 2020-12-17 MED ORDER — PRENATAL MULTIVITAMIN CH
1.0000 | ORAL_TABLET | Freq: Every day | ORAL | Status: DC
  Administered 2020-12-17 – 2020-12-19 (×3): 1 via ORAL
  Filled 2020-12-17 (×3): qty 1

## 2020-12-17 MED ORDER — ONDANSETRON HCL 4 MG/2ML IJ SOLN
INTRAMUSCULAR | Status: AC
  Filled 2020-12-17: qty 2

## 2020-12-17 MED ORDER — SODIUM CHLORIDE (PF) 0.9 % IJ SOLN
INTRAMUSCULAR | Status: AC
  Filled 2020-12-17: qty 50

## 2020-12-17 MED ORDER — PROPOFOL 10 MG/ML IV BOLUS
INTRAVENOUS | Status: AC
  Filled 2020-12-17: qty 20

## 2020-12-17 MED ORDER — ALBUTEROL SULFATE HFA 108 (90 BASE) MCG/ACT IN AERS
INHALATION_SPRAY | RESPIRATORY_TRACT | Status: AC
  Filled 2020-12-17: qty 6.7

## 2020-12-17 MED ORDER — ATROPINE SULFATE 0.4 MG/ML IJ SOLN
INTRAMUSCULAR | Status: AC
  Filled 2020-12-17: qty 2

## 2020-12-17 MED ORDER — LACTATED RINGERS IV SOLN: INTRAVENOUS | Status: DC

## 2020-12-17 MED ORDER — CHLORHEXIDINE GLUCONATE 0.12 % MT SOLN: 15.0000 mL | Freq: Once | OROMUCOSAL | Status: AC

## 2020-12-17 MED ORDER — FENTANYL CITRATE (PF) 100 MCG/2ML IJ SOLN: 25.0000 ug | INTRAMUSCULAR | Status: DC | PRN

## 2020-12-17 MED ORDER — SUCCINYLCHOLINE CHLORIDE 20 MG/ML IJ SOLN
INTRAMUSCULAR | Status: DC | PRN
  Administered 2020-12-17: 100 mg via INTRAVENOUS

## 2020-12-17 MED ORDER — PROPOFOL 10 MG/ML IV BOLUS
INTRAVENOUS | Status: DC | PRN
  Administered 2020-12-17: 150 mg via INTRAVENOUS

## 2020-12-17 MED ORDER — SENNOSIDES-DOCUSATE SODIUM 8.6-50 MG PO TABS: 1.0000 | ORAL_TABLET | Freq: Every evening | ORAL | Status: DC | PRN

## 2020-12-17 MED ORDER — ROCURONIUM BROMIDE 100 MG/10ML IV SOLN
INTRAVENOUS | Status: DC | PRN
  Administered 2020-12-17: 10 mg via INTRAVENOUS
  Administered 2020-12-17: 20 mg via INTRAVENOUS
  Administered 2020-12-17: 10 mg via INTRAVENOUS
  Administered 2020-12-17: 30 mg via INTRAVENOUS

## 2020-12-17 MED ORDER — POVIDONE-IODINE 10 % EX SWAB
2.0000 "application " | Freq: Once | CUTANEOUS | Status: AC
  Administered 2020-12-17: 2 via TOPICAL

## 2020-12-17 MED ORDER — OXYCODONE HCL 5 MG/5ML PO SOLN: 5.0000 mg | Freq: Once | ORAL | Status: DC | PRN

## 2020-12-17 MED ORDER — LIDOCAINE HCL (PF) 2 % IJ SOLN
INTRAMUSCULAR | Status: AC
  Filled 2020-12-17: qty 5

## 2020-12-17 MED ORDER — KETOROLAC TROMETHAMINE 30 MG/ML IJ SOLN
30.0000 mg | Freq: Once | INTRAMUSCULAR | Status: AC
  Administered 2020-12-17: 30 mg via INTRAVENOUS

## 2020-12-17 MED ORDER — DOXYLAMINE SUCCINATE (SLEEP) 25 MG PO TABS
12.5000 mg | ORAL_TABLET | Freq: Every evening | ORAL | Status: DC | PRN
  Filled 2020-12-17: qty 1

## 2020-12-17 MED ORDER — CEFAZOLIN SODIUM-DEXTROSE 2-4 GM/100ML-% IV SOLN
2.0000 g | INTRAVENOUS | Status: AC
  Administered 2020-12-17: 2 g via INTRAVENOUS

## 2020-12-17 MED ORDER — FENTANYL CITRATE (PF) 100 MCG/2ML IJ SOLN
INTRAMUSCULAR | Status: DC | PRN
  Administered 2020-12-17: 25 ug via INTRAVENOUS
  Administered 2020-12-17: 50 ug via INTRAVENOUS
  Administered 2020-12-17 (×3): 25 ug via INTRAVENOUS

## 2020-12-17 MED ORDER — SIMETHICONE 80 MG PO CHEW
80.0000 mg | CHEWABLE_TABLET | Freq: Three times a day (TID) | ORAL | Status: DC
  Administered 2020-12-17 – 2020-12-19 (×7): 80 mg via ORAL
  Filled 2020-12-17 (×7): qty 1

## 2020-12-17 MED ORDER — NEOSTIGMINE METHYLSULFATE 10 MG/10ML IV SOLN
INTRAVENOUS | Status: AC
  Filled 2020-12-17: qty 1

## 2020-12-17 MED ORDER — KETOROLAC TROMETHAMINE 30 MG/ML IJ SOLN
INTRAMUSCULAR | Status: AC
  Filled 2020-12-17: qty 1

## 2020-12-17 MED ORDER — FENTANYL CITRATE (PF) 100 MCG/2ML IJ SOLN
INTRAMUSCULAR | Status: AC
  Administered 2020-12-17: 25 ug via INTRAVENOUS
  Filled 2020-12-17: qty 2

## 2020-12-17 MED ORDER — OXYCODONE HCL 5 MG PO TABS
5.0000 mg | ORAL_TABLET | ORAL | Status: DC | PRN
  Administered 2020-12-17 – 2020-12-18 (×2): 10 mg via ORAL
  Filled 2020-12-17 (×2): qty 1
  Filled 2020-12-17: qty 2

## 2020-12-17 MED ORDER — DOCUSATE SODIUM 100 MG PO CAPS
100.0000 mg | ORAL_CAPSULE | Freq: Two times a day (BID) | ORAL | Status: DC
  Administered 2020-12-17 – 2020-12-18 (×2): 100 mg via ORAL
  Filled 2020-12-17 (×4): qty 1

## 2020-12-17 MED ORDER — SEVOFLURANE IN SOLN
RESPIRATORY_TRACT | Status: AC
  Filled 2020-12-17: qty 250

## 2020-12-17 MED ORDER — NEOSTIGMINE METHYLSULFATE 10 MG/10ML IV SOLN
INTRAVENOUS | Status: DC | PRN
  Administered 2020-12-17: 3 mg via INTRAVENOUS

## 2020-12-17 MED ORDER — BUPIVACAINE HCL (PF) 0.5 % IJ SOLN
INTRAMUSCULAR | Status: AC
  Filled 2020-12-17: qty 30

## 2020-12-17 MED ORDER — ACETAMINOPHEN 500 MG PO TABS
1000.0000 mg | ORAL_TABLET | Freq: Three times a day (TID) | ORAL | Status: DC
  Administered 2020-12-17 – 2020-12-19 (×7): 1000 mg via ORAL
  Filled 2020-12-17 (×8): qty 2

## 2020-12-17 MED ORDER — ORAL CARE MOUTH RINSE: 15.0000 mL | Freq: Once | OROMUCOSAL | Status: AC

## 2020-12-17 MED ORDER — EPHEDRINE SULFATE 50 MG/ML IJ SOLN
INTRAMUSCULAR | Status: DC | PRN
  Administered 2020-12-17: 5 mg via INTRAVENOUS

## 2020-12-17 MED ORDER — ALBUTEROL SULFATE HFA 108 (90 BASE) MCG/ACT IN AERS
INHALATION_SPRAY | RESPIRATORY_TRACT | Status: DC | PRN
  Administered 2020-12-17: 3 via RESPIRATORY_TRACT

## 2020-12-17 MED ORDER — ACETAMINOPHEN 500 MG PO TABS
1000.0000 mg | ORAL_TABLET | ORAL | Status: AC
  Administered 2020-12-17: 1000 mg via ORAL

## 2020-12-17 MED ORDER — CHLORHEXIDINE GLUCONATE 0.12 % MT SOLN
OROMUCOSAL | Status: AC
  Administered 2020-12-17: 15 mL via OROMUCOSAL
  Filled 2020-12-17: qty 15

## 2020-12-17 MED ORDER — ONDANSETRON HCL 4 MG/2ML IJ SOLN
INTRAMUSCULAR | Status: DC | PRN
  Administered 2020-12-17: 4 mg via INTRAVENOUS

## 2020-12-17 MED ORDER — LIDOCAINE HCL (CARDIAC) PF 100 MG/5ML IV SOSY
PREFILLED_SYRINGE | INTRAVENOUS | Status: DC | PRN
  Administered 2020-12-17: 40 mg via INTRAVENOUS

## 2020-12-17 MED ORDER — MENTHOL 3 MG MT LOZG
1.0000 | LOZENGE | OROMUCOSAL | Status: DC | PRN
  Filled 2020-12-17: qty 9

## 2020-12-17 MED ORDER — FAMOTIDINE 20 MG PO TABS: 20.0000 mg | ORAL_TABLET | Freq: Once | ORAL | Status: DC

## 2020-12-17 MED ORDER — ACETAMINOPHEN 500 MG PO TABS
ORAL_TABLET | ORAL | Status: AC
  Filled 2020-12-17: qty 1

## 2020-12-17 MED ORDER — SODIUM CHLORIDE FLUSH 0.9 % IV SOLN
INTRAVENOUS | Status: DC | PRN
  Administered 2020-12-17: 100 mL

## 2020-12-17 MED ORDER — ATROPINE SULFATE 0.4 MG/ML IJ SOLN
INTRAMUSCULAR | Status: DC | PRN
  Administered 2020-12-17: .5 mg via INTRAVENOUS
  Administered 2020-12-17: .1 mg via INTRAVENOUS

## 2020-12-17 MED ORDER — OXYCODONE HCL 5 MG PO TABS
5.0000 mg | ORAL_TABLET | Freq: Once | ORAL | Status: DC | PRN
Start: 2020-12-17 — End: 2020-12-17

## 2020-12-17 SURGICAL SUPPLY — 48 items
APPLICATOR ARISTA FLEXITIP XL (MISCELLANEOUS) IMPLANT
BACTOSHIELD CHG 4% 4OZ (MISCELLANEOUS) ×1
CANISTER SUCT 1200ML W/VALVE (MISCELLANEOUS) ×2 IMPLANT
CNTNR SPEC 2.5X3XGRAD LEK (MISCELLANEOUS) ×1
CONT SPEC 4OZ STER OR WHT (MISCELLANEOUS) ×1
CONTAINER SPEC 2.5X3XGRAD LEK (MISCELLANEOUS) ×1 IMPLANT
DRAPE LAPAROTOMY TRNSV 106X77 (MISCELLANEOUS) ×2 IMPLANT
DRSG TELFA 3X8 NADH (GAUZE/BANDAGES/DRESSINGS) ×2 IMPLANT
ELECT BLADE 6 FLAT ULTRCLN (ELECTRODE) IMPLANT
ELECT REM PT RETURN 9FT ADLT (ELECTROSURGICAL) ×2
ELECTRODE REM PT RTRN 9FT ADLT (ELECTROSURGICAL) ×1 IMPLANT
GAUZE 4X4 16PLY ~~LOC~~+RFID DBL (SPONGE) ×4 IMPLANT
GLOVE SURG SYN 8.0 (GLOVE) ×2 IMPLANT
GLOVE SURG UNDER LTX SZ8 (GLOVE) ×2 IMPLANT
GOWN STRL REUS W/ TWL LRG LVL3 (GOWN DISPOSABLE) ×1 IMPLANT
GOWN STRL REUS W/ TWL XL LVL3 (GOWN DISPOSABLE) ×1 IMPLANT
GOWN STRL REUS W/TWL LRG LVL3 (GOWN DISPOSABLE) ×1
GOWN STRL REUS W/TWL XL LVL3 (GOWN DISPOSABLE) ×1
HEMOSTAT ARISTA ABSORB 3G PWDR (HEMOSTASIS) IMPLANT
IV NS 1000ML (IV SOLUTION) ×1
IV NS 1000ML BAXH (IV SOLUTION) ×1 IMPLANT
KIT TURNOVER CYSTO (KITS) ×2 IMPLANT
LABEL OR SOLS (LABEL) ×2 IMPLANT
MANIFOLD NEPTUNE II (INSTRUMENTS) ×2 IMPLANT
NEEDLE HYPO 21X1.5 SAFETY (NEEDLE) ×2 IMPLANT
PACK BASIN MAJOR ARMC (MISCELLANEOUS) ×2 IMPLANT
RETRACTOR WND ALEXIS-O 25 LRG (MISCELLANEOUS) ×1 IMPLANT
RTRCTR WOUND ALEXIS O 25CM LRG (MISCELLANEOUS) ×2
SCRUB CHG 4% DYNA-HEX 4OZ (MISCELLANEOUS) ×1 IMPLANT
SPONGE KITTNER 5P (MISCELLANEOUS) ×4 IMPLANT
SPONGE T-LAP 18X18 ~~LOC~~+RFID (SPONGE) ×6 IMPLANT
STAPLER INSORB 30 2030 C-SECTI (MISCELLANEOUS) IMPLANT
STAPLER SKIN PROX 35W (STAPLE) ×2 IMPLANT
SUT MNCRL 3-0 UNDYED SH (SUTURE) ×1 IMPLANT
SUT MNCRL AB 3-0 PS2 27 (SUTURE) ×2 IMPLANT
SUT MONOCRYL 3-0 UNDYED (SUTURE) ×1
SUT VIC AB 0 CT1 27 (SUTURE) ×2
SUT VIC AB 0 CT1 27XCR 8 STRN (SUTURE) ×2 IMPLANT
SUT VIC AB 0 CT1 36 (SUTURE) ×10 IMPLANT
SUT VIC AB 2-0 CT1 (SUTURE) ×4 IMPLANT
SUT VIC AB 2-0 SH 27 (SUTURE) ×7
SUT VIC AB 2-0 SH 27XBRD (SUTURE) ×7 IMPLANT
SUT VICRYL PLUS ABS 0 54 (SUTURE) ×2 IMPLANT
SYR 10ML LL (SYRINGE) ×2 IMPLANT
SYR 30ML LL (SYRINGE) ×4 IMPLANT
SYR BULB IRRIG 60ML STRL (SYRINGE) ×2 IMPLANT
TRAY FOLEY MTR SLVR 16FR STAT (SET/KITS/TRAYS/PACK) ×2 IMPLANT
WATER STERILE IRR 1000ML POUR (IV SOLUTION) ×2 IMPLANT

## 2020-12-17 NOTE — Transfer of Care (Signed)
Immediate Anesthesia Transfer of Care Note  Patient: Carolyn Munoz  Procedure(s) Performed: Open OVARIAN CYSTECTOMY with pelvic washings (Bilateral)  Patient Location: PACU  Anesthesia Type:General  Level of Consciousness: awake, drowsy and patient cooperative  Airway & Oxygen Therapy: Patient Spontanous Breathing and Patient connected to face mask oxygen  Post-op Assessment: Report given to RN and Post -op Vital signs reviewed and stable  Post vital signs: Reviewed and stable  Last Vitals:  Vitals Value Taken Time  BP 99/59 12/17/20 1051  Temp    Pulse 100 12/17/20 1057  Resp 22 12/17/20 1057  SpO2 100 % 12/17/20 1057  Vitals shown include unvalidated device data.  Last Pain:  Vitals:   12/17/20 0655  PainSc: 0-No pain         Complications: No notable events documented.

## 2020-12-17 NOTE — Interval H&P Note (Signed)
History and Physical Interval Note:  12/17/2020 7:23 AM  Carolyn Munoz  has presented today for surgery, with the diagnosis of bilateral complex ovarian cysts.  The various methods of treatment have been discussed with the patient and family. After consideration of risks, benefits and other options for treatment, the patient has consented to  Procedure(s): Open OVARIAN CYSTECTOMY with pelvic washings (Bilateral) as a surgical intervention.  The patient's history has been reviewed, patient examined, no change in status, stable for surgery.  I have reviewed the patient's chart and labs.  Questions were answered to the patient's satisfaction.     Benjaman Kindler

## 2020-12-17 NOTE — Anesthesia Postprocedure Evaluation (Signed)
Anesthesia Post Note  Patient: Carolyn Munoz  Procedure(s) Performed: Open OVARIAN CYSTECTOMY with pelvic washings (Bilateral)  Patient location during evaluation: PACU Anesthesia Type: General Level of consciousness: awake and alert Pain management: pain level controlled Vital Signs Assessment: post-procedure vital signs reviewed and stable Respiratory status: spontaneous breathing, nonlabored ventilation, respiratory function stable and patient connected to nasal cannula oxygen Cardiovascular status: blood pressure returned to baseline and stable Postop Assessment: no apparent nausea or vomiting Anesthetic complications: no Comments: PostOp FHR 144 BPM   No notable events documented.   Last Vitals:  Vitals:   12/17/20 1136 12/17/20 1151  BP: (!) 92/51 (!) 92/50  Pulse: 81 80  Resp: 18 17  Temp: 36.4 C   SpO2: 97% 98%    Last Pain:  Vitals:   12/17/20 1136  PainSc: 3                  Precious Haws Emmerich Cryer

## 2020-12-17 NOTE — Progress Notes (Signed)
FHT 158 bpm

## 2020-12-17 NOTE — Anesthesia Procedure Notes (Signed)
Procedure Name: Intubation Date/Time: 12/17/2020 7:48 AM Performed by: Jerrye Noble, CRNA Pre-anesthesia Checklist: Patient identified, Emergency Drugs available, Suction available and Patient being monitored Patient Re-evaluated:Patient Re-evaluated prior to induction Oxygen Delivery Method: Circle system utilized Preoxygenation: Pre-oxygenation with 100% oxygen Induction Type: IV induction and Rapid sequence Laryngoscope Size: McGraph and 3 Grade View: Grade I Tube type: Oral Tube size: 7.0 mm Number of attempts: 1 Airway Equipment and Method: Stylet and Oral airway Placement Confirmation: ETT inserted through vocal cords under direct vision, positive ETCO2 and breath sounds checked- equal and bilateral Secured at: 20 cm Tube secured with: Tape Dental Injury: Teeth and Oropharynx as per pre-operative assessment

## 2020-12-17 NOTE — Anesthesia Preprocedure Evaluation (Signed)
Anesthesia Evaluation  Patient identified by MRN, date of birth, ID band Patient awake    Reviewed: Allergy & Precautions, NPO status , Patient's Chart, lab work & pertinent test results  History of Anesthesia Complications Negative for: history of anesthetic complications  Airway Mallampati: III  TM Distance: >3 FB Neck ROM: full    Dental  (+) Chipped   Pulmonary asthma ,    Pulmonary exam normal        Cardiovascular Exercise Tolerance: Good (-) angina(-) Past MI and (-) DOE negative cardio ROS Normal cardiovascular exam     Neuro/Psych PSYCHIATRIC DISORDERS negative neurological ROS  negative psych ROS   GI/Hepatic negative GI ROS, Neg liver ROS,   Endo/Other  negative endocrine ROS  Renal/GU      Musculoskeletal   Abdominal   Peds  Hematology negative hematology ROS (+)   Anesthesia Other Findings PreOp FHT 158 bpm   Past Medical History: No date: Anxiety No date: Asthma     Comment:  age teens to age 35 No date: Bicuspid aortic valve No date: Complex ovarian cyst No date: Depression  Past Surgical History: 2007: APPENDECTOMY  BMI    Body Mass Index: 25.39 kg/m      Reproductive/Obstetrics (+) Pregnancy                             Anesthesia Physical Anesthesia Plan  ASA: 3  Anesthesia Plan: General ETT   Post-op Pain Management:    Induction: Intravenous  PONV Risk Score and Plan: Ondansetron, Dexamethasone, Midazolam and Treatment may vary due to age or medical condition  Airway Management Planned: Oral ETT  Additional Equipment:   Intra-op Plan:   Post-operative Plan: Extubation in OR  Informed Consent: I have reviewed the patients History and Physical, chart, labs and discussed the procedure including the risks, benefits and alternatives for the proposed anesthesia with the patient or authorized representative who has indicated his/her understanding  and acceptance.     Dental Advisory Given  Plan Discussed with: Anesthesiologist, CRNA and Surgeon  Anesthesia Plan Comments: (Discussed spinal vs GA with the patient.  Patient elected for GA.  Consented for risks to fetus and patient voiced understanding.  Plan for pregnancy friendly anesthetic.  Patient consented for risks of anesthesia including but not limited to:  - adverse reactions to medications - damage to eyes, teeth, lips or other oral mucosa - nerve damage due to positioning  - sore throat or hoarseness - Damage to heart, brain, nerves, lungs, other parts of body or loss of life  Patient voiced understanding.)        Anesthesia Quick Evaluation

## 2020-12-17 NOTE — Progress Notes (Signed)
RN to obtain FHT via doppler but unable to obtain due to dressing and incision site. MD at bedside to perform bedside ultrasound and FHT 144.

## 2020-12-17 NOTE — Discharge Instructions (Signed)
For the next three days, take ibuprofen and acetaminophen on a schedule, every 8 hours. You can take them together or you can intersperse them, and take one every four hours. I also gave you gabapentin for nighttime, to help you sleep and also to control pain. Take gabapentin medicines at night for at least the next 3 nights. You also have a narcotic, oxycodone, to take as needed if the above medicines don't help.  Postop constipation is a major cause of pain. Stay well hydrated, walk as you tolerate, and take over the counter senna as well as stool softeners if you need them.   Signs and Symptoms to Report Call our office at [phone] if you have any of the following.   Fever over 100.4 degrees or higher  Severe stomach pain not relieved with pain medications  Bright red bleeding that's heavier than a period that does not slow with rest  To go the bathroom a lot (frequency), you can't hold your urine (urgency), or it hurts when you empty your bladder (urinate)  Chest pain  Shortness of breath  Pain in the calves of your legs  Severe nausea and vomiting not relieved with anti-nausea medications  Signs of infection around your wounds, such as redness, hot to touch, swelling, green/yellow drainage (like pus), bad smelling discharge  Any concerns  What You Can Expect after Surgery  You may see some pink tinged, bloody fluid and bruising around the wound. This is normal.  You may have a sore throat because of the tube in your mouth during general anesthesia. This will go away in 2 to 3 days.  You may have some stomach cramps.  You may notice spotting on your panties.  You may have pain around the incision sites.   Activities after Your Discharge Follow these guidelines to help speed your recovery at home:  Do the coughing and deep breathing as you did in the hospital for 2 weeks. Use the small blue breathing device, called the incentive spirometer for 2 weeks.  Don't drive if you are  in pain or taking narcotic pain medicine. You may drive when you can safely slam on the brakes, turn the wheel forcefully, and rotate your torso comfortably. This is typically 1-2 weeks. Practice in a parking lot or side street prior to attempting to drive regularly.   Ask others to help with household chores for 4 weeks.  Do not lift anything heavier that 10 pounds for 4-6 weeks. This includes pets, children, and groceries.  Don't do strenuous activities, exercises, or sports like vacuuming, tennis, squash, etc. until your doctor says it is safe to do so. ---If you had a hysterectomy (abdominal, laparoscopic, or vaginal) do not have intercourse for 8-10 weeks.   Walk as you feel able. Rest often since it may take two or three weeks for your energy level to return to normal.   You may climb stairs  Avoid constipation:   -Eat fruits, vegetables, and whole grains. Eat small meals as your appetite will take time to return to normal.   -Drink 6 to 8 glasses of water each day unless your doctor has told you to limit your fluids.   -Use a laxative or stool softener as needed if constipation becomes a problem. You may take Miralax, metamucil, Citrucil, Colace, Senekot, FiberCon, etc. If this does not relieve the constipation, try two tablespoons of Milk Of Magnesia every 8 hours until your bowels move.   You may shower. Gently wash  the wounds with a mild soap and water. Pat dry.  Do not get in a hot tub, swimming pool, etc. until your doctor agrees.  Do not use lotions, oils, powders on the wounds.  Do not douche, use tampons, or have sex until your doctor says it is okay.  Take your pain medicine when you need it. The medicine may not work as well if the pain is bad.  Take the medicines you were taking before surgery. Other medications you will need are pain medications (Norco or Percocet) and nausea medications (Zofran).

## 2020-12-17 NOTE — Op Note (Signed)
Carolyn Munoz PROCEDURE DATE: 12/17/2020  PREOPERATIVE DIAGNOSIS:  - Large bilateral ovarian mature teratoma - [redacted]w[redacted]d pregnancy, EDD 05/30/21  POSTOPERATIVE DIAGNOSIS: The same PROCEDURE:  Open OVARIAN CYSTECTOMY with pelvic washings: 46962 (CPT)  SURGEON:  Dr. Benjaman Kindler, MD ASSISTANT: Dr. Laverta Baltimore, MD Anesthesiologist:  Anesthesiologist: Piscitello, Precious Haws, MD CRNA: Norm Salt, CRNA; Jerrye Noble, CRNA  INDICATIONS: 30 y.o. G1P0  at [redacted]w[redacted]d here for definitive surgical management secondary to the indications listed under preoperative diagnoses; please see preoperative note for further details.  Risks of surgery were discussed with the patient including but not limited to: bleeding which may require transfusion or reoperation; infection which may require antibiotics; injury to bowel, bladder, ureters or other surrounding organs; need for additional procedures; thromboembolic phenomenon, incisional problems and other postoperative/anesthesia complications. Written informed consent was obtained.    FINDINGS: Left ovary with 3 separate dermoid cysts.  The largest was approximately 11 cm and cystic.  Left ovary and adnexa were located in the mid left upper abdomen.  Right ovary with small 4 cm dermoid cyst.  All of the cysts were removed intact.  Normal soft 16-week sized uterus.   ANESTHESIA:    General INTRAVENOUS FLUIDS:1200  ml ESTIMATED BLOOD LOSS:10 ml URINE OUTPUT: 125 ml   SPECIMENS: 3 separate left ovarian cyst.  1 smaller right ovarian cyst.  Hair and lipid material noted. COMPLICATIONS: None immediate  PROCEDURE IN DETAIL:  The patient received prophalactic intravenous antibiotics and had sequential compression devices applied to her lower extremities while in the preoperative area.  She was then taken to the operating room where general anesthesia was administered and was found to be adequate.  She was placed in the a supine position, and was prepped and  draped in a sterile manner.  A formal time out was performed with all team members present and in agreement.  A Pfannenstiel skin incision was made and carried down through the subcutaneous tissue to the fascia. Fascial incision was made and extended transversely with the Mayo scissors. The fascia was separated from the underlying rectus tissue superiorly and inferiorly. The peritoneum was identified and entered bluntly. Peritoneal incision was extended longitudinally.   A large Alexis self-retaining retractor was placed, and the pelvis and abdomen examined.  The left ovary was delivered atraumatically through the Pfannenstiel incision.  On examination, there were 3 separate cysts with normal ovarian tissue at the base.  Using a 15 blade, the ovarian stroma was very carefully incised, and the large cyst delicately shelled out.  The cyst was removed intact.  Bleeding was controlled with cautery, and the remaining 2 cysts on the left were also removed carefully.  Spiraling pursestring stitches were used to close the ovarian stroma, and ensure that the internal cystic spaces were closed and hemostasis was assured.  The adnexa was examined, noted to be hemostatic, and good flow blood flow through the mesosalpinx.  The left adnexa was tucked back into its anatomical position, and attention turned to the right adnexa.  The right ovary was delivered, and the single cyst on the side was removed in the same careful fashion.  This open space was closed as well, and the adnexa noted to be intact.  The right ovary was tucked back into its anatomical location.  The fascia was then reapproximated with running sutures of 0 Vicryl.  The subcutaneous tissue was reapproximated with running sutures of 0 Vicryl. The skin was reapproximated with a 4-0 Monocryl subcuticular stitch.  72ml (in 30 of  0.5% bupivicaine and 2ml of NSS) of liposomal bupivicaine placed in the fascial and skin lines.  Instrument, sponge, and needle  counts were correct prior to the abdominal closure and at the conclusion of the case.   The patient tolerated the procedure well and was transferred to the recovery room in stable condition.   Fetal heart tones in the preoperative area were 158 bpm, and in the PACU were 144

## 2020-12-18 LAB — CBC
HCT: 25.9 % — ABNORMAL LOW (ref 36.0–46.0)
Hemoglobin: 8.6 g/dL — ABNORMAL LOW (ref 12.0–15.0)
MCH: 32.2 pg (ref 26.0–34.0)
MCHC: 33.2 g/dL (ref 30.0–36.0)
MCV: 97 fL (ref 80.0–100.0)
Platelets: 150 10*3/uL (ref 150–400)
RBC: 2.67 MIL/uL — ABNORMAL LOW (ref 3.87–5.11)
RDW: 14.5 % (ref 11.5–15.5)
WBC: 7 10*3/uL (ref 4.0–10.5)
nRBC: 0 % (ref 0.0–0.2)

## 2020-12-18 LAB — BASIC METABOLIC PANEL
Anion gap: 3 — ABNORMAL LOW (ref 5–15)
BUN: 9 mg/dL (ref 6–20)
CO2: 24 mmol/L (ref 22–32)
Calcium: 8.9 mg/dL (ref 8.9–10.3)
Chloride: 108 mmol/L (ref 98–111)
Creatinine, Ser: 0.51 mg/dL (ref 0.44–1.00)
GFR, Estimated: >60 mL/min (ref >60)
Glucose, Bld: 93 mg/dL (ref 70–99)
Potassium: 4 mmol/L (ref 3.5–5.1)
Sodium: 135 mmol/L (ref 135–145)

## 2020-12-18 MED ORDER — DOCUSATE SODIUM 100 MG PO CAPS: 100.0000 mg | ORAL_CAPSULE | Freq: Two times a day (BID) | ORAL | 0 refills | Status: AC

## 2020-12-18 MED ORDER — ONDANSETRON 4 MG PO TBDP
4.0000 mg | ORAL_TABLET | Freq: Four times a day (QID) | ORAL | Status: DC | PRN
  Administered 2020-12-18 – 2020-12-19 (×2): 4 mg via ORAL
  Filled 2020-12-18 (×2): qty 1

## 2020-12-18 MED ORDER — SIMETHICONE 80 MG PO CHEW: 80.0000 mg | CHEWABLE_TABLET | Freq: Three times a day (TID) | ORAL | 0 refills | Status: DC

## 2020-12-18 MED ORDER — GABAPENTIN 300 MG PO CAPS: 300.0000 mg | ORAL_CAPSULE | Freq: Every day | ORAL | 0 refills | Status: DC

## 2020-12-18 MED ORDER — DOXYLAMINE SUCCINATE (SLEEP) 25 MG PO TABS: 12.5000 mg | ORAL_TABLET | Freq: Every evening | ORAL | 0 refills | Status: DC | PRN

## 2020-12-18 MED ORDER — ONDANSETRON HCL 4 MG/2ML IJ SOLN
4.0000 mg | Freq: Four times a day (QID) | INTRAMUSCULAR | Status: DC | PRN
  Administered 2020-12-18: 4 mg via INTRAVENOUS
  Filled 2020-12-18: qty 2

## 2020-12-18 MED ORDER — ACETAMINOPHEN 500 MG PO TABS: 1000.0000 mg | ORAL_TABLET | Freq: Three times a day (TID) | ORAL | 0 refills | Status: DC

## 2020-12-18 MED ORDER — OXYCODONE HCL 5 MG PO TABS: 5.0000 mg | ORAL_TABLET | Freq: Four times a day (QID) | ORAL | 0 refills | Status: AC | PRN

## 2020-12-18 MED ORDER — GABAPENTIN 300 MG PO CAPS
300.0000 mg | ORAL_CAPSULE | Freq: Every day | ORAL | Status: DC
  Administered 2020-12-18: 300 mg via ORAL
  Filled 2020-12-18: qty 1

## 2020-12-18 MED ORDER — ONDANSETRON 4 MG PO TBDP: 4.0000 mg | ORAL_TABLET | Freq: Four times a day (QID) | ORAL | 2 refills | Status: DC | PRN

## 2020-12-18 MED ORDER — IBUPROFEN 600 MG PO TABS: 600.0000 mg | ORAL_TABLET | Freq: Four times a day (QID) | ORAL | 0 refills | Status: AC

## 2020-12-18 MED ORDER — MENTHOL 3 MG MT LOZG: 1.0000 | LOZENGE | OROMUCOSAL | 0 refills | Status: DC | PRN

## 2020-12-18 NOTE — Progress Notes (Signed)
1 Day Post-Op Procedure(s): Open OVARIAN CYSTECTOMY with pelvic washings (Bilateral) Subjective: Pain is adequately controlled but 5/10.  No SOB or CP. Resting quietly. However, more nauseated now than this morning. Some diarrhea but no bowel movement. Less gas than she normally has.  Objective: Vital signs in last 24 hours: Temp:  [98.7 F (37.1 C)-99.5 F (37.5 C)] 99 F (37.2 C) (07/15 1627) Pulse Rate:  [75-83] 81 (07/15 1627) Resp:  [18] 18 (07/15 1627) BP: (85-91)/(51-63) 86/56 (07/15 1627) SpO2:  [98 %-100 %] 100 % (07/15 1627)  Intake/Output  Intake/Output Summary (Last 24 hours) at 12/18/2020 1822 Last data filed at 12/18/2020 1000 Gross per 24 hour  Intake --  Output 1100 ml  Net -1100 ml    Physical Exam:  General: Alert and oriented. CV: RRR Lungs: Clear bilaterally. GI: Soft, Nondistended. Pain with palpation but rovisngs neg. No rebound. Appears more painful than I would expect around umbilicus. Minimal bowel sounds Incisions: Clean and dry. Urine: Clear Extremities: Nontender, no erythema, no edema.  Assessment/Plan: POD# 0 s/p Procedure(s): Open OVARIAN CYSTECTOMY with pelvic washings (Bilateral).  - Encourage Incentive spirometry -  clears for now -Continue oral pain medication, with IV rescue as needed - zofran prn. Will add reglan if needed. Hold off on imaging. - not consistently OOB, and pain still significant.  - discharge: TBD   Benjaman Kindler, MD   LOS: 1 day   Benjaman Kindler 12/18/2020, 6:22 PM

## 2020-12-18 NOTE — Progress Notes (Signed)
Obstetric and Gynecology  POD1, ovarian cystectomy, [redacted]wks gestation  Subjective  Patient doing well, no complaints, tolerating PO intake, tolerating pain with PO meds, ambulating without difficulty, voiding spontaneously.     Denies CP, SOB, F/C, N/V/D, or leg pain.   Objective   Objective:   Vitals:   12/17/20 1914 12/17/20 2329 12/18/20 0300 12/18/20 0847  BP: (!) 86/54 91/63 (!) 86/51 (!) 85/51  Pulse: 78 83 79 75  Resp:  18 18 18   Temp:  99.4 F (37.4 C) 98.7 F (37.1 C) 99.5 F (37.5 C)  TempSrc:  Oral Oral Oral  SpO2: 99% 98% 99% 100%  Weight:      Height:       Temp:  [97.6 F (36.4 C)-99.5 F (37.5 C)] 99.5 F (37.5 C) (07/15 0847) Pulse Rate:  [75-83] 75 (07/15 0847) Resp:  [17-20] 18 (07/15 0847) BP: (85-92)/(50-63) 85/51 (07/15 0847) SpO2:  [97 %-100 %] 100 % (07/15 0847) I/O last 3 completed shifts: In: 1100 [I.V.:1000; IV Piggyback:100] Out: 835 [Urine:825; Blood:10] Total I/O In: -  Out: 400 [Urine:400]  Intake/Output Summary (Last 24 hours) at 12/18/2020 1124 Last data filed at 12/18/2020 1000 Gross per 24 hour  Intake --  Output 1100 ml  Net -1100 ml     Current Vital Signs 24h Vital Sign Ranges  T 99.5 F (37.5 C) Temp  Avg: 98.7 F (37.1 C)  Min: 97.6 F (36.4 C)  Max: 99.5 F (37.5 C)  BP (!) 85/51  BP  Min: 85/51  Max: 92/55  HR 75 Pulse  Avg: 78.6  Min: 75  Max: 83  RR 18 Resp  Avg: 18.3  Min: 17  Max: 20  SaO2 100 % Room Air SpO2  Avg: 98.5 %  Min: 97 %  Max: 100 %           24 Hour I/O Current Shift I/O  Time Ins Outs 07/14 0701 - 07/15 0700 In: 1100 [I.V.:1000] Out: 835 [Urine:825] 07/15 0701 - 07/15 1900 In: -  Out: 400 [Urine:400]   General: NAD Cardiovascular: RRR, no murmurs Pulmonary: CTAB, normal respiratory effort Abdomen: Benign. +BS, Incision:CDI with honeycomb dressing in place. Gravid. FHR 150bpm via doppler.  Extremities: No erythema or cords, no calf tenderness, with normal peripheral  pulses.  Labs: Results for orders placed or performed during the hospital encounter of 12/17/20 (from the past 24 hour(s))  CBC     Status: Abnormal   Collection Time: 12/18/20  4:36 AM  Result Value Ref Range   WBC 7.0 4.0 - 10.5 K/uL   RBC 2.67 (L) 3.87 - 5.11 MIL/uL   Hemoglobin 8.6 (L) 12.0 - 15.0 g/dL   HCT 25.9 (L) 36.0 - 46.0 %   MCV 97.0 80.0 - 100.0 fL   MCH 32.2 26.0 - 34.0 pg   MCHC 33.2 30.0 - 36.0 g/dL   RDW 14.5 11.5 - 15.5 %   Platelets 150 150 - 400 K/uL   nRBC 0.0 0.0 - 0.2 %  Basic metabolic panel     Status: Abnormal   Collection Time: 12/18/20  4:36 AM  Result Value Ref Range   Sodium 135 135 - 145 mmol/L   Potassium 4.0 3.5 - 5.1 mmol/L   Chloride 108 98 - 111 mmol/L   CO2 24 22 - 32 mmol/L   Glucose, Bld 93 70 - 99 mg/dL   BUN 9 6 - 20 mg/dL   Creatinine, Ser 0.51 0.44 - 1.00 mg/dL   Calcium  8.9 8.9 - 10.3 mg/dL   GFR, Estimated >60 >60 mL/min   Anion gap 3 (L) 5 - 15       Assessment   30 y.o. [place] Hospital Day: 2   Plan   1. Daily FHR via dppler.  2. Discussed pain mgmt with pt, encouraged regular scheduled PO pain medication, ambulation, splinting for movement and coughing to support abdomen.    Francetta Found, CNM 12/18/2020

## 2020-12-18 NOTE — Progress Notes (Signed)
FHT 150 per Hassan Buckler, CNM.

## 2020-12-19 NOTE — Progress Notes (Signed)
Discharge instructions, prescriptions, education, and appointments given and explained. Pt verbalized understanding with no further questions. Pt wheeled to personal vehicle via staff with significant other

## 2020-12-19 NOTE — Progress Notes (Signed)
FHT 154 per doppler

## 2020-12-19 NOTE — Discharge Summary (Signed)
GynecologicalDischarge Summary  Patient Name: Carolyn Munoz DOB: 09-Jan-1991 MRN: [id number]  Date of Admission: 12/17/2020 Date of Discharge: 12/19/2020  Hospital course:   The patient was admitted on the day of scheduled surgery, For complete operative information, please see operative report.  Postoperatively she was admitted to the floor. Pain was initially managed with IV meds and transitioned to PO when the patient was tolerating a regular diet. On POD1, concern for ileus due to abdominal pain and 2 episodes of vomiting, but improved flatus, clear liquids overnight and reports resolved vomiting and significant pain mgmt.  Postoperative labs were stable (Hgb 11.2 --> 8.6 and creatinine 0.3 --> 0.51).    Foley cath was discontinued on postoperative day number 1 and patient passed a voiding trial without difficulty.   Patient was felt to be stable for discharge on postoperative day number 2 when she was tolerating a regular diet, pain was controlled with po pain medications, and she was ambulating and voiding without difficulty. Vital signs were stable and physical exam remained benign throughout her hospital stay. Incision was clean,dry, and intact with honeycomb dsg intact.  Rx given per Dr beasley's orders.   She was given specific instructions and numbers to call in written and verbal format. She verbalized understanding, agrees with the plan of care, and all questions answered to her satisfaction.  Discharge Physical Exam:  BP (!) 81/53 (BP Location: Right Arm) Comment: nurse Chelsea notified  Pulse 81   Temp 98.9 F (37.2 C) (Oral)   Resp 18   Ht 5' (1.524 m)   Wt 59 kg   LMP 08/23/2020 (Exact Date) Comment: FHT in pre-op: 158  SpO2 100%   BMI 25.39 kg/m   General: NAD CV: RRR Pulm: CTABL, nl effort ABD: s/nd/nt, + BS, pt reports flatus and feeling much more comfortable today.  Incision: c/d/i with honeycomb Dsg intact.  DVT Evaluation: LE non-ttp, no evidence of DVT on  exam.  FHR via doppler: 150bpm   Hemoglobin  Date Value Ref Range Status  12/18/2020 8.6 (L) 12.0 - 15.0 g/dL Final   HCT  Date Value Ref Range Status  12/18/2020 25.9 (L) 36.0 - 46.0 % Final      Plan:  Carolyn Munoz was discharged to home in good condition. Follow-up appointment at [place] [facility] in 1-2 weeks   Discharge Medications: Allergies as of 12/19/2020       Reactions   Banana    Abdominal Pain        Medication List     TAKE these medications    acetaminophen 500 MG tablet Commonly known as: TYLENOL Take 2 tablets (1,000 mg total) by mouth every 8 (eight) hours. What changed:  how much to take when to take this reasons to take this   docusate sodium 100 MG capsule Commonly known as: COLACE Take 1 capsule (100 mg total) by mouth 2 (two) times daily.   doxylamine (Sleep) 25 MG tablet Commonly known as: UNISOM Take 0.5 tablets (12.5 mg total) by mouth at bedtime as needed (nausea).   gabapentin 300 MG capsule Commonly known as: NEURONTIN Take 1 capsule (300 mg total) by mouth at bedtime for 3 days.   ibuprofen 600 MG tablet Commonly known as: ADVIL Take 1 tablet (600 mg total) by mouth every 6 (six) hours for 15 days.   menthol-cetylpyridinium 3 MG lozenge Commonly known as: CEPACOL Take 1 lozenge (3 mg total) by mouth every 2 (two) hours as needed for sore throat.  ondansetron 4 MG disintegrating tablet Commonly known as: ZOFRAN-ODT Take 1 tablet (4 mg total) by mouth every 6 (six) hours as needed for nausea or vomiting.   oxyCODONE 5 MG immediate release tablet Commonly known as: Oxy IR/ROXICODONE Take 1 tablet (5 mg total) by mouth every 6 (six) hours as needed for up to 7 days for moderate pain or severe pain.   PRENATAL GUMMIES PO Take 2 capsules by mouth daily.   simethicone 80 MG chewable tablet Commonly known as: MYLICON Chew 1 tablet (80 mg total) by mouth 3 (three) times daily.         Follow-up Information      Benjaman Kindler, MD Follow up in 2 week(s).   Specialty: Obstetrics and Gynecology Why: For postop check Contact information: [place] [address] [phone]                 Signed: Francetta Found, CNM 4:12 PM

## 2020-12-19 NOTE — Progress Notes (Signed)
FHT 152 per doppler

## 2020-12-21 LAB — CYTOLOGY - NON PAP

## 2020-12-22 LAB — SURGICAL PATHOLOGY

## 2021-05-10 LAB — OB RESULTS CONSOLE GBS: GBS: NEGATIVE

## 2021-05-10 LAB — OB RESULTS CONSOLE GC/CHLAMYDIA
Chlamydia: NEGATIVE
Gonorrhea: NEGATIVE

## 2021-05-10 LAB — OB RESULTS CONSOLE HIV ANTIBODY (ROUTINE TESTING): HIV: NONREACTIVE

## 2021-05-30 ENCOUNTER — Inpatient Hospital Stay: Admit: 2021-05-30 | Payer: Self-pay

## 2021-06-02 ENCOUNTER — Other Ambulatory Visit: Payer: Self-pay | Admitting: Obstetrics and Gynecology

## 2021-06-02 NOTE — Progress Notes (Signed)
Carolyn Munoz is a 30 y.o. G1P0 female at [redacted]w[redacted]d dated by LMP.  She will present to L&D for IOL for post-dates pregnancy  Pregnancy Issues: Scoliosis Anemia Bicuspid aortic valve Bilateral dermoid cysts  Asthma  History of anxiety/depression    Prenatal care site: [facility]    Pertinent Results:  Prenatal Labs: Blood type/Rh A pos  Antibody screen neg  Rubella Immune  Varicella Immune  RPR NR  HBsAg Neg  HIV NR  GC neg  Chlamydia neg  Genetic screening Declined  1 hour GTT 90  3 hour GTT   GBS Neg    5. Post Partum Planning: - Infant feeding: Breastfeeding - Contraception: TBD - Tdap:  Not received AP - Flu:   Received 04/14/21  Linda Hedges, CNM 06/02/2021 1:38 PM

## 2021-06-04 ENCOUNTER — Other Ambulatory Visit
Admission: RE | Admit: 2021-06-04 | Discharge: 2021-06-04 | Disposition: A | Discharge status: Home / Self Care | Payer: PRIVATE HEALTH INSURANCE | Source: Ambulatory Visit | Attending: Obstetrics and Gynecology | Admitting: Obstetrics and Gynecology

## 2021-06-04 ENCOUNTER — Other Ambulatory Visit: Payer: Self-pay

## 2021-06-04 DIAGNOSIS — Z01812 Encounter for preprocedural laboratory examination: Secondary | ICD-10-CM | POA: Insufficient documentation

## 2021-06-04 DIAGNOSIS — Z20822 Contact with and (suspected) exposure to covid-19: Secondary | ICD-10-CM | POA: Insufficient documentation

## 2021-06-04 DIAGNOSIS — Z1152 Encounter for screening for COVID-19: Secondary | ICD-10-CM

## 2021-06-05 LAB — SARS CORONAVIRUS 2 (TAT 6-24 HRS): SARS Coronavirus 2: NEGATIVE

## 2021-06-07 ENCOUNTER — Other Ambulatory Visit: Payer: Self-pay

## 2021-06-07 ENCOUNTER — Inpatient Hospital Stay: Payer: BC Managed Care – PPO | Admitting: Anesthesiology

## 2021-06-07 ENCOUNTER — Inpatient Hospital Stay
Admission: EM | Admit: 2021-06-07 | Discharge: 2021-06-10 | DRG: 786 | Disposition: A | Discharge status: Home / Self Care | Payer: BC Managed Care – PPO | Attending: Obstetrics and Gynecology | Admitting: Obstetrics and Gynecology

## 2021-06-07 DIAGNOSIS — O480 Post-term pregnancy: Secondary | ICD-10-CM | POA: Diagnosis present

## 2021-06-07 DIAGNOSIS — O339 Maternal care for disproportion, unspecified: Secondary | ICD-10-CM | POA: Diagnosis present

## 2021-06-07 DIAGNOSIS — Z98891 History of uterine scar from previous surgery: Secondary | ICD-10-CM

## 2021-06-07 DIAGNOSIS — O26833 Pregnancy related renal disease, third trimester: Secondary | ICD-10-CM | POA: Diagnosis present

## 2021-06-07 DIAGNOSIS — N200 Calculus of kidney: Secondary | ICD-10-CM | POA: Diagnosis present

## 2021-06-07 DIAGNOSIS — R34 Anuria and oliguria: Secondary | ICD-10-CM

## 2021-06-07 DIAGNOSIS — D62 Acute posthemorrhagic anemia: Secondary | ICD-10-CM | POA: Diagnosis not present

## 2021-06-07 DIAGNOSIS — N179 Acute kidney failure, unspecified: Secondary | ICD-10-CM | POA: Diagnosis not present

## 2021-06-07 DIAGNOSIS — Z3A41 41 weeks gestation of pregnancy: Secondary | ICD-10-CM | POA: Diagnosis not present

## 2021-06-07 DIAGNOSIS — O9081 Anemia of the puerperium: Secondary | ICD-10-CM | POA: Diagnosis not present

## 2021-06-07 DIAGNOSIS — Z20822 Contact with and (suspected) exposure to covid-19: Secondary | ICD-10-CM | POA: Diagnosis present

## 2021-06-07 DIAGNOSIS — O904 Postpartum acute kidney failure: Secondary | ICD-10-CM | POA: Diagnosis not present

## 2021-06-07 DIAGNOSIS — O1495 Unspecified pre-eclampsia, complicating the puerperium: Secondary | ICD-10-CM

## 2021-06-07 LAB — CBC
HCT: 33.3 % — ABNORMAL LOW (ref 36.0–46.0)
Hemoglobin: 11.5 g/dL — ABNORMAL LOW (ref 12.0–15.0)
MCH: 33.5 pg (ref 26.0–34.0)
MCHC: 34.5 g/dL (ref 30.0–36.0)
MCV: 97.1 fL (ref 80.0–100.0)
Platelets: 165 10*3/uL (ref 150–400)
RBC: 3.43 MIL/uL — ABNORMAL LOW (ref 3.87–5.11)
RDW: 14.2 % (ref 11.5–15.5)
WBC: 7.4 10*3/uL (ref 4.0–10.5)
nRBC: 0 % (ref 0.0–0.2)

## 2021-06-07 LAB — TYPE AND SCREEN
ABO/RH(D): A POS
Antibody Screen: NEGATIVE

## 2021-06-07 MED ORDER — FENTANYL CITRATE (PF) 100 MCG/2ML IJ SOLN
50.0000 ug | INTRAMUSCULAR | Status: DC | PRN
  Administered 2021-06-07 (×2): 100 ug via INTRAVENOUS
  Filled 2021-06-07 (×2): qty 2

## 2021-06-07 MED ORDER — OXYTOCIN-SODIUM CHLORIDE 30-0.9 UT/500ML-% IV SOLN
2.5000 [IU]/h | INTRAVENOUS | Status: DC
  Filled 2021-06-07 (×2): qty 500

## 2021-06-07 MED ORDER — OXYCODONE-ACETAMINOPHEN 5-325 MG PO TABS: 2.0000 | ORAL_TABLET | ORAL | Status: DC | PRN

## 2021-06-07 MED ORDER — DIPHENHYDRAMINE HCL 50 MG/ML IJ SOLN: 12.5000 mg | INTRAMUSCULAR | Status: DC | PRN

## 2021-06-07 MED ORDER — OXYTOCIN-SODIUM CHLORIDE 30-0.9 UT/500ML-% IV SOLN
1.0000 m[IU]/min | INTRAVENOUS | Status: DC
  Administered 2021-06-07: 2 m[IU]/min via INTRAVENOUS
  Filled 2021-06-07: qty 500

## 2021-06-07 MED ORDER — PHENYLEPHRINE 40 MCG/ML (10ML) SYRINGE FOR IV PUSH (FOR BLOOD PRESSURE SUPPORT)
80.0000 ug | PREFILLED_SYRINGE | INTRAVENOUS | Status: AC | PRN
  Administered 2021-06-07 (×3): 80 ug via INTRAVENOUS

## 2021-06-07 MED ORDER — LACTATED RINGERS IV SOLN
500.0000 mL | Freq: Once | INTRAVENOUS | Status: AC
  Administered 2021-06-08: 500 mL via INTRAVENOUS

## 2021-06-07 MED ORDER — LIDOCAINE-EPINEPHRINE (PF) 1.5 %-1:200000 IJ SOLN
INTRAMUSCULAR | Status: DC | PRN
  Administered 2021-06-07: 3 mL via EPIDURAL

## 2021-06-07 MED ORDER — ONDANSETRON HCL 4 MG/2ML IJ SOLN
4.0000 mg | Freq: Four times a day (QID) | INTRAMUSCULAR | Status: DC | PRN
  Administered 2021-06-07 – 2021-06-08 (×3): 4 mg via INTRAVENOUS
  Filled 2021-06-07 (×3): qty 2

## 2021-06-07 MED ORDER — SOD CITRATE-CITRIC ACID 500-334 MG/5ML PO SOLN: 30.0000 mL | ORAL | Status: DC | PRN

## 2021-06-07 MED ORDER — FENTANYL-BUPIVACAINE-NACL 0.5-0.125-0.9 MG/250ML-% EP SOLN
12.0000 mL/h | EPIDURAL | Status: DC | PRN
  Administered 2021-06-07 – 2021-06-08 (×2): 12 mL/h via EPIDURAL
  Filled 2021-06-07 (×2): qty 250

## 2021-06-07 MED ORDER — LACTATED RINGERS IV SOLN: INTRAVENOUS | Status: DC

## 2021-06-07 MED ORDER — MISOPROSTOL 200 MCG PO TABS
ORAL_TABLET | ORAL | Status: AC
  Filled 2021-06-07: qty 4

## 2021-06-07 MED ORDER — LIDOCAINE HCL (PF) 1 % IJ SOLN
INTRAMUSCULAR | Status: DC | PRN
  Administered 2021-06-07: 3 mL via SUBCUTANEOUS

## 2021-06-07 MED ORDER — EPHEDRINE 5 MG/ML INJ: 10.0000 mg | INTRAVENOUS | Status: DC | PRN

## 2021-06-07 MED ORDER — OXYCODONE-ACETAMINOPHEN 5-325 MG PO TABS: 1.0000 | ORAL_TABLET | ORAL | Status: DC | PRN

## 2021-06-07 MED ORDER — LACTATED RINGERS IV SOLN
500.0000 mL | INTRAVENOUS | Status: DC | PRN
  Administered 2021-06-07 (×3): 500 mL via INTRAVENOUS

## 2021-06-07 MED ORDER — TERBUTALINE SULFATE 1 MG/ML IJ SOLN: 0.2500 mg | Freq: Once | INTRAMUSCULAR | Status: DC | PRN

## 2021-06-07 MED ORDER — OXYTOCIN 10 UNIT/ML IJ SOLN
INTRAMUSCULAR | Status: AC
  Filled 2021-06-07: qty 2

## 2021-06-07 MED ORDER — PHENYLEPHRINE 40 MCG/ML (10ML) SYRINGE FOR IV PUSH (FOR BLOOD PRESSURE SUPPORT)
80.0000 ug | PREFILLED_SYRINGE | INTRAVENOUS | Status: DC | PRN
  Filled 2021-06-07: qty 10

## 2021-06-07 MED ORDER — MISOPROSTOL 25 MCG QUARTER TABLET
25.0000 ug | ORAL_TABLET | ORAL | Status: DC | PRN
  Administered 2021-06-07: 25 ug via VAGINAL
  Filled 2021-06-07: qty 1

## 2021-06-07 MED ORDER — LIDOCAINE HCL (PF) 1 % IJ SOLN
INTRAMUSCULAR | Status: AC
  Filled 2021-06-07: qty 30

## 2021-06-07 MED ORDER — OXYTOCIN BOLUS FROM INFUSION: 333.0000 mL | Freq: Once | INTRAVENOUS | Status: DC

## 2021-06-07 MED ORDER — LIDOCAINE HCL (PF) 1 % IJ SOLN: 30.0000 mL | INTRAMUSCULAR | Status: DC | PRN

## 2021-06-07 MED ORDER — SODIUM CHLORIDE 0.9 % IV SOLN
INTRAVENOUS | Status: DC | PRN
  Administered 2021-06-07: 8 mL via EPIDURAL
  Administered 2021-06-08: 10 mL via EPIDURAL

## 2021-06-07 MED ORDER — AMMONIA AROMATIC IN INHA
RESPIRATORY_TRACT | Status: AC
  Filled 2021-06-07: qty 10

## 2021-06-07 MED ORDER — ACETAMINOPHEN 325 MG PO TABS
650.0000 mg | ORAL_TABLET | ORAL | Status: DC | PRN
  Administered 2021-06-08: 650 mg via ORAL
  Filled 2021-06-07: qty 2

## 2021-06-07 NOTE — Progress Notes (Signed)
Labor Progress Note  Vicie Cech is a 31 y.o. G1P0 at [redacted]w[redacted]d by LMP admitted for induction of labor due to Post dates. Due date 05/30/21.  Subjective: feeling some discomfort with UCs, more in her back.   Objective: BP 115/76 (BP Location: Right Arm)    Pulse 91    Temp 98.8 F (37.1 C) (Oral)    Resp 15    Ht 5\' 4"  (1.626 m)    Wt 62 kg    LMP 08/23/2020 (Exact Date) Comment: FHT in pre-op: 158   BMI 23.46 kg/m  Notable VS details: reviewed.   Fetal Assessment: FHT:  FHR: 140 bpm, variability: moderate,  accelerations:  Present,  decelerations:  Absent Category/reactivity:  Category I UC:   regular, every 1-4 minutes, Pitocin at 36mu/min SVE:   Cook cath removed intact, 5/75/-3, soft/posterior. Dark red bloody show noted.  Membrane status: intact Amniotic color: n/a  Labs: Lab Results  Component Value Date   WBC 7.4 06/07/2021   HGB 11.5 (L) 06/07/2021   HCT 33.3 (L) 06/07/2021   MCV 97.1 06/07/2021   PLT 165 06/07/2021    Assessment / Plan: IOL at 41.1wks for late term  Labor: s/p 1 dose of cytotec, Cook cath and Pitocin infusing. Unable to AROM due to high fetal station, will facilitate fetal rotation and descent with maternal position changes.  Preeclampsia:   no e/o Pre-E Fetal Wellbeing:  Category I Pain Control:  Labor support without medications and IV pain meds I/D:  n/a Anticipated MOD:  NSVD  Francetta Found, CNM 06/07/2021, 5:49 PM

## 2021-06-07 NOTE — H&P (Signed)
OB History & Physical   History of Present Illness:  Chief Complaint:   HPI:  Carolyn Munoz is a 31 y.o. G1P0 female at [redacted]w[redacted]d dated by LMP, EDD 05/30/21.  She presents to L&D for scheduled IOL.   Active FM; feeling onset of ctx since Cytotec placed at 0600; denies LOF or VB.     Pregnancy Issues: Scoliosis Anemia Bicuspid aortic valve Bilateral dermoid cysts  Asthma  History of anxiety/depression   Maternal Medical History:   Past Medical History:  Diagnosis Date   Anxiety    Asthma    age teens to age 66   Bicuspid aortic valve    Complex ovarian cyst    Depression     Past Surgical History:  Procedure Laterality Date   APPENDECTOMY  2007   OVARIAN CYST REMOVAL Bilateral 12/17/2020   Procedure: Open OVARIAN CYSTECTOMY with pelvic washings;  Surgeon: Benjaman Kindler, MD;  Location: [facility] ORS;  Service: Gynecology;  Laterality: Bilateral;    Allergies  Allergen Reactions   Banana     Abdominal Pain    Prior to Admission medications   Medication Sig Start Date End Date Taking? Authorizing Provider  acetaminophen (TYLENOL) 500 MG tablet Take 2 tablets (1,000 mg total) by mouth every 8 (eight) hours. 12/18/20   Samyria Rudie, Murray Hodgkins, CNM  doxylamine, Sleep, (UNISOM) 25 MG tablet Take 0.5 tablets (12.5 mg total) by mouth at bedtime as needed (nausea). 12/18/20   Fausto Sampedro, Murray Hodgkins, CNM  gabapentin (NEURONTIN) 300 MG capsule Take 1 capsule (300 mg total) by mouth at bedtime for 3 days. 12/18/20 12/21/20  Parth Mccormac, Murray Hodgkins, CNM  menthol-cetylpyridinium (CEPACOL) 3 MG lozenge Take 1 lozenge (3 mg total) by mouth every 2 (two) hours as needed for sore throat. 12/18/20   Markeis Allman, Murray Hodgkins, CNM  ondansetron (ZOFRAN-ODT) 4 MG disintegrating tablet Take 1 tablet (4 mg total) by mouth every 6 (six) hours as needed for nausea or vomiting. 12/18/20   Lesieli Bresee, Murray Hodgkins, CNM  Prenatal MV & Min w/FA-DHA (PRENATAL GUMMIES PO) Take 2 capsules by mouth daily.    [provider]  simethicone  (MYLICON) 80 MG chewable tablet Chew 1 tablet (80 mg total) by mouth 3 (three) times daily. 12/18/20   Dariella Gillihan, Murray Hodgkins, CNM     Prenatal care site: [place]  Social History: She  reports that she has never smoked. She has never used smokeless tobacco. She reports that she does not currently use alcohol. She reports that she does not use drugs.  Family History: family history includes Breast cancer in her mother; Heart disease in her mother.   Review of Systems: A full review of systems was performed and negative except as noted in the HPI.     Physical Exam:  Vital Signs: BP 113/71 (BP Location: Right Arm)    Pulse 78    Temp 98.6 F (37 C) (Oral)    Resp 16    Ht 5\' 4"  (1.626 m)    Wt 62 kg    LMP 08/23/2020 (Exact Date) Comment: FHT in pre-op: 158   BMI 23.46 kg/m  General: no acute distress.  HEENT: normocephalic, atraumatic Heart: regular rate & rhythm.  No murmurs/rubs/gallops Lungs: clear to auscultation bilaterally, normal respiratory effort Abdomen: soft, gravid, non-tender;  EFW: 7lbs Pelvic: scant bloody show noted, cervix soft and posterior.   External: Normal external female genitalia  Cervix: Dilation: 1 / Effacement (%): 30 / Station: -3   - SSE: cervix visualized, Lacinda Axon  cath placed and filled uterine and vaginal balloon with 24ml each LR. Pt tolerated well.    Extremities: non-tender, symmetric, No edema bilaterally.  DTRs: 2+  Neurologic: Alert & oriented x 3.    Results for orders placed or performed during the hospital encounter of 06/07/21 (from the past 24 hour(s))  Type and screen     Status: None   Collection Time: 06/07/21  5:34 AM  Result Value Ref Range   ABO/RH(D) A POS    Antibody Screen NEG    Sample Expiration      06/10/2021,2359 Performed at [facility], [address] [place], [place] [address]   CBC     Status: Abnormal   Collection Time: 06/07/21  6:00 AM  Result Value Ref Range   WBC 7.4 4.0 - 10.5 K/uL   RBC 3.43  (L) 3.87 - 5.11 MIL/uL   Hemoglobin 11.5 (L) 12.0 - 15.0 g/dL   HCT 33.3 (L) 36.0 - 46.0 %   MCV 97.1 80.0 - 100.0 fL   MCH 33.5 26.0 - 34.0 pg   MCHC 34.5 30.0 - 36.0 g/dL   RDW 14.2 11.5 - 15.5 %   Platelets 165 150 - 400 K/uL   nRBC 0.0 0.0 - 0.2 %    Pertinent Results:  Prenatal Labs: Blood type/Rh A Pos  Antibody screen neg  Rubella Immune  Varicella Immune  RPR NR  HBsAg Neg  HIV NR  GC neg  Chlamydia neg  Genetic screening  declined  1 hour GTT  90  3 hour GTT   GBS  neg   FHT: 145bpm, mod variability, + accels, no decels TOCO: q1-3, palp mild.  SVE:  Dilation: 1 / Effacement (%): 30 / Station: -3    Cephalic by leopolds and confirmed with [place]  No results found.  Assessment:  Carolyn Munoz is a 31 y.o. G1P0 female at [redacted]w[redacted]d with scheduled late term induction of labor.   Plan:  1. Admit to Labor & Delivery; consents reviewed and obtained  2. Fetal Well being  - Fetal Tracing: A Pos - Group B Streptococcus ppx indicated: Neg - Presentation: Cephalic confirmed by [place] and leopolds   3. Routine OB: - Prenatal labs reviewed, as above - Rh A pos - CBC, T&S, RPR on admit - Clear fluids, IVF  4. Induction of Labor -  Contractions: external toco in place -  Pelvis adequate for TOL -  Plan for induction with Cytotec, cook cath placed now.  -  Plan for continuous fetal monitoring  -  Maternal pain control as desired - Anticipate vaginal delivery  5. Post Partum Planning: - Infant feeding: Breastfeeding - Contraception: TBD - Tdap:  Not received AP - Flu:   Received 04/14/21  Murray Hodgkins [name], CNM 06/07/21 9:26 AM

## 2021-06-07 NOTE — Anesthesia Preprocedure Evaluation (Addendum)
Anesthesia Evaluation  Patient identified by MRN, date of birth, ID band Patient awake    Reviewed: Allergy & Precautions, NPO status , Patient's Chart, lab work & pertinent test results  History of Anesthesia Complications Negative for: history of anesthetic complications  Airway Mallampati: II  TM Distance: >3 FB Neck ROM: Full    Dental no notable dental hx. (+) Teeth Intact   Pulmonary asthma , neg sleep apnea, neg COPD, Patient abstained from smoking.Not current smoker,  Rarely uses inhalers   Pulmonary exam normal breath sounds clear to auscultation       Cardiovascular Exercise Tolerance: Good METS(-) hypertension(-) CAD and (-) Past MI negative cardio ROS  (-) dysrhythmias  Rhythm:Regular Rate:Normal - Systolic murmurs    Neuro/Psych PSYCHIATRIC DISORDERS Anxiety Depression negative neurological ROS     GI/Hepatic neg GERD  ,(+)     (-) substance abuse  ,   Endo/Other  neg diabetes  Renal/GU negative Renal ROS     Musculoskeletal   Abdominal   Peds  Hematology  (+) anemia , No bleeding disorders   Anesthesia Other Findings Update 1:00pm 06/08/2020: Pt now with failure to progress beyond 8 cm and will have a cesarean section.   Past Medical History: No date: Anxiety No date: Asthma     Comment:  age teens to age 69 No date: Bicuspid aortic valve No date: Complex ovarian cyst No date: Depression  Reproductive/Obstetrics (+) Pregnancy                           Anesthesia Physical Anesthesia Plan  ASA: 2  Anesthesia Plan: Epidural   Post-op Pain Management:    Induction:   PONV Risk Score and Plan: 2 and Treatment may vary due to age or medical condition and Ondansetron  Airway Management Planned: Natural Airway  Additional Equipment:   Intra-op Plan:   Post-operative Plan:   Informed Consent: I have reviewed the patients History and Physical, chart, labs and  discussed the procedure including the risks, benefits and alternatives for the proposed anesthesia with the patient or authorized representative who has indicated his/her understanding and acceptance.       Plan Discussed with: Surgeon  Anesthesia Plan Comments: (Discussed R/B/A of neuraxial anesthesia technique with patient: - rare risks of spinal/epidural hematoma, nerve damage, infection - Risk of PDPH - Risk of itching - Risk of nausea and vomiting - Risk of poor block necessitating replacement of epidural. - Risk of allergic reactions. Patient voiced understanding.)        Anesthesia Quick Evaluation

## 2021-06-07 NOTE — Progress Notes (Signed)
Labor Progress Note  Carolyn Munoz is a 31 y.o. G1P0 at [redacted]w[redacted]d by LMP admitted for induction of labor due to Post dates. Due date 05/30/21.  Subjective: just received second dose of fentanyl- improved pain mgmt.   Objective: BP 111/61 (BP Location: Right Arm)    Pulse 92    Temp 98.7 F (37.1 C) (Oral)    Resp 16    Ht 5\' 4"  (1.626 m)    Wt 62 kg    LMP 08/23/2020 (Exact Date) Comment: FHT in pre-op: 158   BMI 23.46 kg/m  Notable VS details: reviewed.   Fetal Assessment: FHT:  FHR: 140 bpm, variability: moderate,  accelerations:  Present,  decelerations:  Absent Category/reactivity:  Category I UC:   regular, every 2-3 minutes, Pitocin at 38mu/min SVE:    5-6/75/-1, soft/posterior. Dark red bloody show noted.  - AROM performed, large amount clear fluid.  Membrane status: intact Amniotic color: n/a  Labs: Lab Results  Component Value Date   WBC 7.4 06/07/2021   HGB 11.5 (L) 06/07/2021   HCT 33.3 (L) 06/07/2021   MCV 97.1 06/07/2021   PLT 165 06/07/2021    Assessment / Plan: IOL at 41.1wks for late term  Labor: s/p 1 dose of cytotec, Cook cath and Pitocin infusing. AROM now.  Preeclampsia:   no e/o Pre-E Fetal Wellbeing:  Category I Pain Control:  Labor support without medications and IV pain meds I/D:  n/a Anticipated MOD:  NSVD  Francetta Found, CNM 06/07/2021, 7:52 PM

## 2021-06-07 NOTE — Progress Notes (Signed)
Patient arrived to unit for labor induction. Patient placed on EFM monitor and TOCO to non tender area of abdomen. History reviewed. Visitation policy explained. Will begin to admit patient

## 2021-06-07 NOTE — Anesthesia Procedure Notes (Signed)
Epidural Patient location during procedure: OB Start time: 06/07/2021 8:26 PM End time: 06/07/2021 8:42 PM  Staffing Anesthesiologist: Arita Miss, MD Performed: anesthesiologist   Preanesthetic Checklist Completed: patient identified, IV checked, site marked, risks and benefits discussed, surgical consent, monitors and equipment checked, pre-op evaluation and timeout performed  Epidural Patient position: sitting Prep: ChloraPrep Patient monitoring: heart rate, continuous pulse ox and blood pressure Approach: midline Location: L3-L4 Injection technique: LOR saline  Needle:  Needle type: Tuohy  Needle gauge: 17 G Needle length: 9 cm Needle insertion depth: 5 cm Catheter type: closed end flexible Catheter size: 19 Gauge Catheter at skin depth: 10 cm Test dose: negative and 1.5% lidocaine with Epi 1:200 K  Assessment Sensory level: T10 Events: blood not aspirated, injection not painful, no injection resistance, no paresthesia and negative IV test  Additional Notes first attempt Pt. Evaluated and documentation done after procedure finished. Patient identified. Risks/Benefits/Options discussed with patient including but not limited to bleeding, infection, nerve damage, paralysis, failed block, incomplete pain control, headache, blood pressure changes, nausea, vomiting, reactions to medication both or allergic, itching and postpartum back pain. Confirmed with bedside nurse the patient's most recent platelet count. Confirmed with patient that they are not currently taking any anticoagulation, have any bleeding history or any family history of bleeding disorders. Patient expressed understanding and wished to proceed. All questions were answered. Sterile technique was used throughout the entire procedure. Please see nursing notes for vital signs. Test dose was given through epidural catheter and negative prior to continuing to dose epidural or start infusion. Warning signs of high block given  to the patient including shortness of breath, tingling/numbness in hands, complete motor block, or any concerning symptoms with instructions to call for help. Patient was given instructions on fall risk and not to get out of bed. All questions and concerns addressed with instructions to call with any issues or inadequate analgesia.     Patient tolerated the insertion well without immediate complications.  Reason for block: procedure for painReason for block:procedure for pain

## 2021-06-07 NOTE — Progress Notes (Signed)
Labor Progress Note  Jeni Duling is a 31 y.o. G1P0 at [redacted]w[redacted]d by LMP admitted for induction of labor due to Post dates. Due date 05/30/21.  Subjective: feeling some discomfort with UCs, rating 4/10. Better after having IVPM- Fentanyl.   Objective: BP 115/76 (BP Location: Right Arm)    Pulse 91    Temp 98.8 F (37.1 C) (Oral)    Resp 15    Ht 5\' 4"  (1.626 m)    Wt 62 kg    LMP 08/23/2020 (Exact Date) Comment: FHT in pre-op: 158   BMI 23.46 kg/m  Notable VS details: reviewed.   Fetal Assessment: FHT:  FHR: 135 bpm, variability: moderate,  accelerations:  Present,  decelerations:  Absent Category/reactivity:  Category I UC:   regular, every 1.5-3 minutes, Pitocin at 39mu/min SVE:   Cook cath remain in place, approx 4cm and thinning. Dark red bloody show noted.  Membrane status: intact Amniotic color: n/a  Labs: Lab Results  Component Value Date   WBC 7.4 06/07/2021   HGB 11.5 (L) 06/07/2021   HCT 33.3 (L) 06/07/2021   MCV 97.1 06/07/2021   PLT 165 06/07/2021    Assessment / Plan: Induction of labor due to postterm,  progressing well on pitocin  Labor: s/p 1 dose of cytotec, Cook cath placed and Pitocin infusing.  Preeclampsia:   no e/o Pre-E Fetal Wellbeing:  Category I Pain Control:  Labor support without medications and IV pain meds I/D:  n/a Anticipated MOD:  NSVD  Francetta Found, CNM 06/07/2021, 3:35 PM

## 2021-06-08 ENCOUNTER — Encounter
Admission: EM | Disposition: A | Discharge status: Home / Self Care | Payer: Self-pay | Source: Home / Self Care | Attending: Obstetrics and Gynecology

## 2021-06-08 ENCOUNTER — Encounter: Payer: Self-pay | Admitting: Obstetrics and Gynecology

## 2021-06-08 LAB — CBC
HCT: 35.4 % — ABNORMAL LOW (ref 36.0–46.0)
Hemoglobin: 11.6 g/dL — ABNORMAL LOW (ref 12.0–15.0)
MCH: 32.1 pg (ref 26.0–34.0)
MCHC: 32.8 g/dL (ref 30.0–36.0)
MCV: 98.1 fL (ref 80.0–100.0)
Platelets: 172 10*3/uL (ref 150–400)
RBC: 3.61 MIL/uL — ABNORMAL LOW (ref 3.87–5.11)
RDW: 14.5 % (ref 11.5–15.5)
WBC: 15.4 10*3/uL — ABNORMAL HIGH (ref 4.0–10.5)
nRBC: 0 % (ref 0.0–0.2)

## 2021-06-08 LAB — COMPREHENSIVE METABOLIC PANEL
ALT: 19 U/L (ref 0–44)
AST: 25 U/L (ref 15–41)
Albumin: 2.9 g/dL — ABNORMAL LOW (ref 3.5–5.0)
Alkaline Phosphatase: 146 U/L — ABNORMAL HIGH (ref 38–126)
Anion gap: 9 (ref 5–15)
BUN: 13 mg/dL (ref 6–20)
CO2: 18 mmol/L — ABNORMAL LOW (ref 22–32)
Calcium: 8.5 mg/dL — ABNORMAL LOW (ref 8.9–10.3)
Chloride: 106 mmol/L (ref 98–111)
Creatinine, Ser: 1.07 mg/dL — ABNORMAL HIGH (ref 0.44–1.00)
GFR, Estimated: >60 mL/min (ref >60)
Glucose, Bld: 89 mg/dL (ref 70–99)
Potassium: 4.1 mmol/L (ref 3.5–5.1)
Sodium: 133 mmol/L — ABNORMAL LOW (ref 135–145)
Total Bilirubin: 1 mg/dL (ref 0.3–1.2)
Total Protein: 5.8 g/dL — ABNORMAL LOW (ref 6.5–8.1)

## 2021-06-08 LAB — RPR: RPR Ser Ql: NONREACTIVE

## 2021-06-08 SURGERY — Surgical Case
Anesthesia: Epidural

## 2021-06-08 MED ORDER — FLEET ENEMA 7-19 GM/118ML RE ENEM: 1.0000 | ENEMA | Freq: Every day | RECTAL | Status: DC | PRN

## 2021-06-08 MED ORDER — SODIUM CHLORIDE 0.9 % IV SOLN
INTRAVENOUS | Status: DC | PRN
  Administered 2021-06-08: 100 mL

## 2021-06-08 MED ORDER — PHENYLEPHRINE HCL (PRESSORS) 10 MG/ML IV SOLN
INTRAVENOUS | Status: DC | PRN
Start: 2021-06-08 — End: 2021-06-08
  Administered 2021-06-08 (×2): 80 ug via INTRAVENOUS

## 2021-06-08 MED ORDER — COCONUT OIL OIL
1.0000 "application " | TOPICAL_OIL | Status: DC | PRN
  Filled 2021-06-08: qty 120

## 2021-06-08 MED ORDER — PRENATAL MULTIVITAMIN CH: 1.0000 | ORAL_TABLET | Freq: Every day | ORAL | Status: DC

## 2021-06-08 MED ORDER — ACETAMINOPHEN 500 MG PO TABS
1000.0000 mg | ORAL_TABLET | Freq: Four times a day (QID) | ORAL | Status: DC
  Administered 2021-06-08: 1000 mg via ORAL
  Filled 2021-06-08: qty 2

## 2021-06-08 MED ORDER — NALOXONE HCL 0.4 MG/ML IJ SOLN: 0.4000 mg | INTRAMUSCULAR | Status: DC | PRN

## 2021-06-08 MED ORDER — DIPHENHYDRAMINE HCL 50 MG/ML IJ SOLN
INTRAMUSCULAR | Status: AC
  Filled 2021-06-08: qty 1

## 2021-06-08 MED ORDER — MEASLES, MUMPS & RUBELLA VAC IJ SOLR
0.5000 mL | Freq: Once | INTRAMUSCULAR | Status: DC
  Filled 2021-06-08 (×2): qty 0.5

## 2021-06-08 MED ORDER — DIPHENHYDRAMINE HCL 50 MG/ML IJ SOLN
50.0000 mg | Freq: Once | INTRAMUSCULAR | Status: AC
  Administered 2021-06-08: 50 mg via INTRAVENOUS

## 2021-06-08 MED ORDER — KETOROLAC TROMETHAMINE 30 MG/ML IJ SOLN
INTRAMUSCULAR | Status: AC
  Filled 2021-06-08: qty 1

## 2021-06-08 MED ORDER — SIMETHICONE 80 MG PO CHEW
80.0000 mg | CHEWABLE_TABLET | Freq: Three times a day (TID) | ORAL | Status: DC
  Administered 2021-06-09 – 2021-06-10 (×4): 80 mg via ORAL
  Filled 2021-06-08 (×5): qty 1

## 2021-06-08 MED ORDER — FERROUS SULFATE 325 (65 FE) MG PO TABS
325.0000 mg | ORAL_TABLET | Freq: Two times a day (BID) | ORAL | Status: DC
  Administered 2021-06-09 – 2021-06-10 (×3): 325 mg via ORAL
  Filled 2021-06-08 (×3): qty 1

## 2021-06-08 MED ORDER — MIDAZOLAM HCL 2 MG/2ML IJ SOLN
INTRAMUSCULAR | Status: AC
  Filled 2021-06-08: qty 2

## 2021-06-08 MED ORDER — SCOPOLAMINE 1 MG/3DAYS TD PT72
1.0000 | MEDICATED_PATCH | Freq: Once | TRANSDERMAL | Status: DC
  Administered 2021-06-08: 1.5 mg via TRANSDERMAL
  Filled 2021-06-08: qty 1

## 2021-06-08 MED ORDER — FENTANYL CITRATE (PF) 100 MCG/2ML IJ SOLN
INTRAMUSCULAR | Status: AC
  Filled 2021-06-08: qty 2

## 2021-06-08 MED ORDER — LACTATED RINGERS IV SOLN: INTRAVENOUS | Status: DC

## 2021-06-08 MED ORDER — OXYCODONE HCL 5 MG PO TABS
5.0000 mg | ORAL_TABLET | ORAL | Status: DC | PRN
  Administered 2021-06-09 – 2021-06-10 (×2): 5 mg via ORAL
  Filled 2021-06-08 (×2): qty 1

## 2021-06-08 MED ORDER — SODIUM CHLORIDE (PF) 0.9 % IJ SOLN
INTRAMUSCULAR | Status: AC
  Filled 2021-06-08: qty 50

## 2021-06-08 MED ORDER — KETOROLAC TROMETHAMINE 30 MG/ML IJ SOLN
30.0000 mg | Freq: Four times a day (QID) | INTRAMUSCULAR | Status: DC
  Administered 2021-06-08 – 2021-06-09 (×2): 30 mg via INTRAVENOUS
  Filled 2021-06-08 (×3): qty 1

## 2021-06-08 MED ORDER — PHENYLEPHRINE HCL-NACL 20-0.9 MG/250ML-% IV SOLN
INTRAVENOUS | Status: DC | PRN
  Administered 2021-06-08: 50 ug/min via INTRAVENOUS

## 2021-06-08 MED ORDER — PHENYLEPHRINE HCL-NACL 20-0.9 MG/250ML-% IV SOLN
INTRAVENOUS | Status: AC
  Filled 2021-06-08: qty 250

## 2021-06-08 MED ORDER — LIDOCAINE HCL (PF) 2 % IJ SOLN
INTRAMUSCULAR | Status: DC | PRN
Start: 2021-06-08 — End: 2021-06-08
  Administered 2021-06-08: 200 mg via INTRADERMAL
  Administered 2021-06-08 (×2): 100 mg via INTRADERMAL

## 2021-06-08 MED ORDER — IBUPROFEN 600 MG PO TABS: 600.0000 mg | ORAL_TABLET | Freq: Four times a day (QID) | ORAL | Status: DC

## 2021-06-08 MED ORDER — BUPIVACAINE LIPOSOME 1.3 % IJ SUSP
INTRAMUSCULAR | Status: AC
  Filled 2021-06-08: qty 20

## 2021-06-08 MED ORDER — OXYCODONE HCL 5 MG PO TABS: 5.0000 mg | ORAL_TABLET | Freq: Four times a day (QID) | ORAL | Status: DC | PRN

## 2021-06-08 MED ORDER — BISACODYL 10 MG RE SUPP: 10.0000 mg | Freq: Every day | RECTAL | Status: DC | PRN

## 2021-06-08 MED ORDER — SODIUM CHLORIDE 0.9% FLUSH: 3.0000 mL | INTRAVENOUS | Status: DC | PRN

## 2021-06-08 MED ORDER — SENNOSIDES-DOCUSATE SODIUM 8.6-50 MG PO TABS
2.0000 | ORAL_TABLET | ORAL | Status: DC
  Administered 2021-06-08 – 2021-06-09 (×2): 2 via ORAL
  Filled 2021-06-08 (×2): qty 2

## 2021-06-08 MED ORDER — OXYTOCIN-SODIUM CHLORIDE 30-0.9 UT/500ML-% IV SOLN
2.5000 [IU]/h | INTRAVENOUS | Status: AC
  Administered 2021-06-08: 2.5 [IU]/h via INTRAVENOUS
  Filled 2021-06-08: qty 500

## 2021-06-08 MED ORDER — KETOROLAC TROMETHAMINE 30 MG/ML IJ SOLN: 30.0000 mg | Freq: Four times a day (QID) | INTRAMUSCULAR | Status: DC | PRN

## 2021-06-08 MED ORDER — MORPHINE SULFATE (PF) 0.5 MG/ML IJ SOLN
INTRAMUSCULAR | Status: AC
  Filled 2021-06-08: qty 10

## 2021-06-08 MED ORDER — FENTANYL CITRATE (PF) 100 MCG/2ML IJ SOLN
INTRAMUSCULAR | Status: DC | PRN
  Administered 2021-06-08 (×2): 100 ug via EPIDURAL

## 2021-06-08 MED ORDER — DIPHENHYDRAMINE HCL 50 MG/ML IJ SOLN: 12.5000 mg | INTRAMUSCULAR | Status: DC | PRN

## 2021-06-08 MED ORDER — LIDOCAINE HCL 2 % IJ SOLN
INTRAMUSCULAR | Status: AC
  Filled 2021-06-08: qty 10

## 2021-06-08 MED ORDER — SODIUM CHLORIDE (PF) 0.9 % IJ SOLN
INTRAMUSCULAR | Status: AC
  Filled 2021-06-08: qty 10

## 2021-06-08 MED ORDER — KETAMINE HCL 50 MG/ML IJ SOLN
INTRAMUSCULAR | Status: AC
  Filled 2021-06-08: qty 10

## 2021-06-08 MED ORDER — DIPHENHYDRAMINE HCL 25 MG PO CAPS: 25.0000 mg | ORAL_CAPSULE | Freq: Four times a day (QID) | ORAL | Status: DC | PRN

## 2021-06-08 MED ORDER — OXYTOCIN-SODIUM CHLORIDE 30-0.9 UT/500ML-% IV SOLN
INTRAVENOUS | Status: DC | PRN
  Administered 2021-06-08: 300 mL via INTRAVENOUS

## 2021-06-08 MED ORDER — BUPIVACAINE LIPOSOME 1.3 % IJ SUSP: 20.0000 mL | Freq: Once | INTRAMUSCULAR | Status: DC

## 2021-06-08 MED ORDER — EPHEDRINE SULFATE-NACL 50-0.9 MG/10ML-% IV SOSY
PREFILLED_SYRINGE | INTRAVENOUS | Status: DC | PRN
  Administered 2021-06-08: 10 mg via INTRAVENOUS

## 2021-06-08 MED ORDER — CEFAZOLIN SODIUM-DEXTROSE 2-4 GM/100ML-% IV SOLN
2.0000 g | INTRAVENOUS | Status: AC
  Administered 2021-06-08: 2 g via INTRAVENOUS
  Filled 2021-06-08: qty 100

## 2021-06-08 MED ORDER — DEXAMETHASONE SODIUM PHOSPHATE 10 MG/ML IJ SOLN
INTRAMUSCULAR | Status: AC
  Filled 2021-06-08: qty 1

## 2021-06-08 MED ORDER — SOD CITRATE-CITRIC ACID 500-334 MG/5ML PO SOLN: 30.0000 mL | ORAL | Status: AC

## 2021-06-08 MED ORDER — NALOXONE HCL 4 MG/10ML IJ SOLN
1.0000 ug/kg/h | INTRAVENOUS | Status: DC | PRN
  Filled 2021-06-08: qty 5

## 2021-06-08 MED ORDER — ONDANSETRON HCL 4 MG/2ML IJ SOLN: 4.0000 mg | Freq: Three times a day (TID) | INTRAMUSCULAR | Status: DC | PRN

## 2021-06-08 MED ORDER — ONDANSETRON HCL 4 MG/2ML IJ SOLN
INTRAMUSCULAR | Status: DC | PRN
  Administered 2021-06-08: 4 mg via INTRAVENOUS

## 2021-06-08 MED ORDER — SODIUM CHLORIDE 0.9% FLUSH
50.0000 mL | Freq: Once | INTRAVENOUS | Status: DC
  Filled 2021-06-08: qty 51

## 2021-06-08 MED ORDER — ACETAMINOPHEN 500 MG PO TABS
1000.0000 mg | ORAL_TABLET | Freq: Four times a day (QID) | ORAL | Status: DC
  Administered 2021-06-08 – 2021-06-10 (×6): 1000 mg via ORAL
  Filled 2021-06-08 (×6): qty 2

## 2021-06-08 MED ORDER — ONDANSETRON HCL 4 MG/2ML IJ SOLN
INTRAMUSCULAR | Status: AC
  Filled 2021-06-08: qty 2

## 2021-06-08 MED ORDER — DIBUCAINE (PERIANAL) 1 % EX OINT
1.0000 | TOPICAL_OINTMENT | CUTANEOUS | Status: DC | PRN
Start: 2021-06-08 — End: 2021-06-10

## 2021-06-08 MED ORDER — KETOROLAC TROMETHAMINE 30 MG/ML IJ SOLN
30.0000 mg | Freq: Four times a day (QID) | INTRAMUSCULAR | Status: DC | PRN
  Administered 2021-06-08: 30 mg via INTRAVENOUS
  Filled 2021-06-08: qty 1

## 2021-06-08 MED ORDER — DEXAMETHASONE SODIUM PHOSPHATE 10 MG/ML IJ SOLN
INTRAMUSCULAR | Status: DC | PRN
  Administered 2021-06-08: 10 mg via INTRAVENOUS

## 2021-06-08 MED ORDER — WITCH HAZEL-GLYCERIN EX PADS: 1.0000 "application " | MEDICATED_PAD | CUTANEOUS | Status: DC | PRN

## 2021-06-08 MED ORDER — BUPIVACAINE HCL (PF) 0.5 % IJ SOLN: 30.0000 mL | Freq: Once | INTRAMUSCULAR | Status: DC

## 2021-06-08 MED ORDER — SIMETHICONE 80 MG PO CHEW: 80.0000 mg | CHEWABLE_TABLET | ORAL | Status: DC | PRN

## 2021-06-08 MED ORDER — METHYLERGONOVINE MALEATE 0.2 MG/ML IJ SOLN
INTRAMUSCULAR | Status: AC
  Filled 2021-06-08: qty 1

## 2021-06-08 MED ORDER — FENTANYL CITRATE (PF) 100 MCG/2ML IJ SOLN
INTRAMUSCULAR | Status: DC | PRN
  Administered 2021-06-08 (×2): 50 ug via INTRAVENOUS

## 2021-06-08 MED ORDER — SOD CITRATE-CITRIC ACID 500-334 MG/5ML PO SOLN
ORAL | Status: AC
  Administered 2021-06-08: 30 mL via ORAL
  Filled 2021-06-08: qty 15

## 2021-06-08 MED ORDER — MENTHOL 3 MG MT LOZG
1.0000 | LOZENGE | OROMUCOSAL | Status: DC | PRN
Start: 2021-06-08 — End: 2021-06-10
  Filled 2021-06-08: qty 9

## 2021-06-08 MED ORDER — GABAPENTIN 300 MG PO CAPS
300.0000 mg | ORAL_CAPSULE | Freq: Every day | ORAL | Status: DC
  Administered 2021-06-08 – 2021-06-09 (×2): 300 mg via ORAL
  Filled 2021-06-08 (×2): qty 1

## 2021-06-08 MED ORDER — SODIUM CHLORIDE 0.9 % IV SOLN
500.0000 mg | INTRAVENOUS | Status: AC
  Administered 2021-06-08: 500 mg via INTRAVENOUS
  Filled 2021-06-08: qty 5

## 2021-06-08 MED ORDER — MORPHINE SULFATE (PF) 0.5 MG/ML IJ SOLN
INTRAMUSCULAR | Status: DC | PRN
  Administered 2021-06-08: 3 mg via EPIDURAL

## 2021-06-08 MED ORDER — DIPHENHYDRAMINE HCL 25 MG PO CAPS: 25.0000 mg | ORAL_CAPSULE | ORAL | Status: DC | PRN

## 2021-06-08 MED ORDER — MEPERIDINE HCL 25 MG/ML IJ SOLN: 6.2500 mg | INTRAMUSCULAR | Status: DC | PRN

## 2021-06-08 MED ORDER — MIDAZOLAM HCL 2 MG/2ML IJ SOLN
INTRAMUSCULAR | Status: DC | PRN
  Administered 2021-06-08: 2 mg via INTRAVENOUS

## 2021-06-08 MED ORDER — TETANUS-DIPHTH-ACELL PERTUSSIS 5-2.5-18.5 LF-MCG/0.5 IM SUSY
0.5000 mL | PREFILLED_SYRINGE | Freq: Once | INTRAMUSCULAR | Status: DC
  Filled 2021-06-08: qty 0.5

## 2021-06-08 MED ORDER — DEXMEDETOMIDINE (PRECEDEX) IN NS 20 MCG/5ML (4 MCG/ML) IV SYRINGE
PREFILLED_SYRINGE | INTRAVENOUS | Status: DC | PRN
  Administered 2021-06-08: 8 ug via INTRAVENOUS
  Administered 2021-06-08: 4 ug via INTRAVENOUS
  Administered 2021-06-08: 8 ug via INTRAVENOUS

## 2021-06-08 SURGICAL SUPPLY — 33 items
APL PRP STRL LF DISP 70% ISPRP (MISCELLANEOUS) ×1
CHLORAPREP W/TINT 26 (MISCELLANEOUS) ×2 IMPLANT
DRSG TELFA 3X8 NADH (GAUZE/BANDAGES/DRESSINGS) ×2 IMPLANT
ELECT REM PT RETURN 9FT ADLT (ELECTROSURGICAL) ×2
ELECTRODE REM PT RTRN 9FT ADLT (ELECTROSURGICAL) ×1 IMPLANT
EXTRACTOR VACUUM KIWI (MISCELLANEOUS) ×1 IMPLANT
GAUZE SPONGE 4X4 12PLY STRL (GAUZE/BANDAGES/DRESSINGS) ×2 IMPLANT
GOWN STRL REUS W/ TWL LRG LVL3 (GOWN DISPOSABLE) ×3 IMPLANT
GOWN STRL REUS W/TWL LRG LVL3 (GOWN DISPOSABLE) ×6
MANIFOLD NEPTUNE II (INSTRUMENTS) ×2 IMPLANT
MAT PREVALON FULL STRYKER (MISCELLANEOUS) ×2 IMPLANT
NDL HYPO 25GX1X1/2 BEV (NEEDLE) ×1 IMPLANT
NEEDLE HYPO 25GX1X1/2 BEV (NEEDLE) ×2 IMPLANT
NS IRRIG 1000ML POUR BTL (IV SOLUTION) ×2 IMPLANT
PACK C SECTION AR (MISCELLANEOUS) ×2 IMPLANT
PAD DRESSING TELFA 3X8 NADH (GAUZE/BANDAGES/DRESSINGS) ×1 IMPLANT
PAD OB MATERNITY 4.3X12.25 (PERSONAL CARE ITEMS) ×2 IMPLANT
PAD PREP 24X41 OB/GYN DISP (PERSONAL CARE ITEMS) ×2 IMPLANT
PENCIL SMOKE EVACUATOR (MISCELLANEOUS) ×2 IMPLANT
SCRUB EXIDINE 4% CHG 4OZ (MISCELLANEOUS) ×2 IMPLANT
STAPLER INSORB 30 2030 C-SECTI (MISCELLANEOUS) ×1 IMPLANT
SUT MNCRL 4-0 (SUTURE) ×2
SUT MNCRL 4-0 27XMFL (SUTURE) ×1
SUT VIC AB 0 CT1 36 (SUTURE) ×4 IMPLANT
SUT VIC AB 0 CTX 36 (SUTURE) ×4
SUT VIC AB 0 CTX36XBRD ANBCTRL (SUTURE) ×2 IMPLANT
SUT VIC AB 2-0 SH 27 (SUTURE) ×4
SUT VIC AB 2-0 SH 27XBRD (SUTURE) ×2 IMPLANT
SUT VICRYL 3-0 27IN SH (SUTURE) ×1 IMPLANT
SUT VICRYL+ 3-0 36IN CT-1 (SUTURE) ×2 IMPLANT
SUTURE MNCRL 4-0 27XMF (SUTURE) ×1 IMPLANT
SYR 30ML LL (SYRINGE) ×4 IMPLANT
WATER STERILE IRR 500ML POUR (IV SOLUTION) ×2 IMPLANT

## 2021-06-08 NOTE — Progress Notes (Signed)
Labor Progress Note  Carolyn Munoz is a 31 y.o. G1P0 at [redacted]w[redacted]d by LMP admitted for induction of labor due to Post dates. Due date 05/30/2021.  Subjective: she reports continued discomfort with contractions, denies rectal pressure  Objective: BP 123/86    Pulse 83    Temp 98.6 F (37 C) (Oral)    Resp 18    Ht 5\' 4"  (1.626 m)    Wt 62 kg    LMP 08/23/2020 (Exact Date) Comment: FHT in pre-op: 158   SpO2 96%    BMI 23.46 kg/m  Notable VS details: reviewed  Fetal Assessment: FHT:  FHR: 130 bpm, variability: moderate,  accelerations:  Present,  decelerations:  Absent Category/reactivity:  Category I UC:   regular, every 2 minutes MVUs: 100-145 SVE:    Dilation: 8.5cm  Effacement: 100%  Station:  +2 (caput)  Consistency: soft  Position: anterior  Membrane status:AROM @ 1933 Amniotic color: clear  Labs: Lab Results  Component Value Date   WBC 15.4 (H) 06/08/2021   HGB 11.6 (L) 06/08/2021   HCT 35.4 (L) 06/08/2021   MCV 98.1 06/08/2021   PLT 172 06/08/2021   CMP Latest Ref Rng & Units 06/08/2021 12/18/2020 12/15/2020  Glucose 70 - 99 mg/dL 89 93 91  BUN 6 - 20 mg/dL 13 9 11   Creatinine 0.44 - 1.00 mg/dL 1.07(H) 0.51 <0.30(L)  Sodium 135 - 145 mmol/L 133(L) 135 134(L)  Potassium 3.5 - 5.1 mmol/L 4.1 4.0 3.8  Chloride 98 - 111 mmol/L 106 108 105  CO2 22 - 32 mmol/L 18(L) 24 23  Calcium 8.9 - 10.3 mg/dL 8.5(L) 8.9 9.4  Total Protein 6.5 - 8.1 g/dL 5.8(L) - -  Total Bilirubin 0.3 - 1.2 mg/dL 1.0 - -  Alkaline Phos 38 - 126 U/L 146(H) - -  AST 15 - 41 U/L 25 - -  ALT 0 - 44 U/L 19 - -    Assessment / Plan: IOL at 41.2 for postdates  Labor:  she has made minimal change since 1045, more caput noted causing the lower station. Called Dr. Leafy Ro to discuss failure to progress, awaiting response.  Pitocin stopped.  - continued minimal urine output, CMP reviewed.  Preeclampsia:  no signs or symptoms of toxicity Fetal Wellbeing:  Category I Pain Control:  Epidural I/D:   axillary  temperature 98. 6 oral. No fetal tachycardia, WBCs 15.4 Anticipated MOD:   primary cesarean birth.  [name], CNM 06/08/2021, 12:22 PM

## 2021-06-08 NOTE — Progress Notes (Signed)
Labor Progress Note  Carolyn Munoz is a 31 y.o. G1P0 at [redacted]w[redacted]d by LMP admitted for induction of labor due to Post dates. Due date 05/30/21.  Subjective: comfortable now. Received TC from nursing about pt with discomfort and cervix with unilateral swelling around 0440.   Objective: BP (!) 105/54 (BP Location: Right Arm)    Pulse 81    Temp 98.6 F (37 C) (Oral)    Resp 18    Ht 5\' 4"  (1.626 m)    Wt 62 kg    LMP 08/23/2020 (Exact Date) Comment: FHT in pre-op: 158   SpO2 98%    BMI 23.46 kg/m  Notable VS details: reviewed.   Fetal Assessment: FHT:  FHR: 140 bpm, variability: moderate,  accelerations:  Present,  decelerations:  Absent Category/reactivity:  Category I UC:   regular, every 2-3 minutes, Pitocin at 42mu/min SVE:    5-6/75/-1, soft/posterior. Dark red bloody show noted.  - AROM performed, large amount clear fluid.  Membrane status: intact Amniotic color: n/a  Labs: Lab Results  Component Value Date   WBC 7.4 06/07/2021   HGB 11.5 (L) 06/07/2021   HCT 33.3 (L) 06/07/2021   MCV 97.1 06/07/2021   PLT 165 06/07/2021    Assessment / Plan: IOL at 41.2wks for late term  Labor: s/p 1 dose of cytotec, Cook cath and Pitocin infusing. AROM at 1933.  - Cervical change with significant effacement noted at 0100 exam.  - further cervical change to 7cm, but with some unilateral edema and abdominal discomfort- instructed to check anesthesia level, redose as needed. Benadryl 50mg  PO given. Repositioning with side lying release to facilitate rotation from asynclitism.  - Pt feeling more comfortable at this time, will plan recheck cervix at 6.30-7, IUPC if no cervical change.  Preeclampsia:   no e/o Pre-E Fetal Wellbeing:  Category I Pain Control:  Epidural I/D:  n/a Anticipated MOD:  NSVD  Murray Hodgkins Charlotte Brafford, CNM 06/08/2021, 5:59 AM

## 2021-06-08 NOTE — Op Note (Addendum)
°  Cesarean Section Procedure Note  Date of procedure: 06/08/2021   Pre-operative Diagnosis: Intrauterine pregnancy at [redacted]w[redacted]d;  - Failed IOL - Pt stalled at 8cm for 5 hours prior to delivery - laparotomy during this pregnancy for dermoid cyst removal  Post-operative Diagnosis: same, delivered.  Procedure: Primary Low Transverse Cesarean Section through Pfannenstiel incision  Surgeon: Benjaman Kindler, MD  Assistant(s):  Lucrezia Europe, CNM   Anesthesia: Epidural anesthesia  Anesthesiologist: Iran Ouch, MD Anesthesiologist: Iran Ouch, MD; Arita Miss, MD CRNA: Aline Brochure, CRNA; Tollie Eth, CRNA  Estimated Blood Loss:   845cc         Drains: foley         Total IV Fluids: 651ml  Urine Output: 257ml         Specimens: none         Complications:  Pain control was subotpimal, and iv meds required. Length of surgery increased to allow for pain control         Disposition: PACU - hemodynamically stable.         Condition: stable  Findings:  A female infant "Harrell Gave" in deep OP cephalic presentation. Amniotic fluid - Clear  Birth weight 3970 g.  Apgars of 8 and 9 at one and five minutes respectively.  Intact placenta with a three-vessel cord.  Grossly normal uterus, tubes and ovaries bilaterally. Minimal intraabdominal adhesions were noted, but significant fascial adhesions. Bilateral ovaries wnl.  Indications: cephalo-pelvic disproportion, failed induction, failure to progress: arrest of dilation, and OP position  Procedure Details  The patient was taken to Operating Room, identified as the correct patient and the procedure verified as C-Section Delivery. A formal Time Out was held with all team members present and in agreement.  After induction of anesthesia, the patient was draped and prepped in the usual sterile manner. A Pfannenstiel skin incision was made and carried down through the subcutaneous tissue to the fascia. Fascial  incision was made and extended transversely with the Mayo scissors. The fascia was separated from the underlying rectus tissue superiorly and inferiorly. The peritoneum was identified and entered bluntly. Peritoneal incision was extended longitudinally. The utero-vesical peritoneal reflection was incised transversely and a bladder flap was created digitally.   A low transverse hysterotomy was made. The fetus was delivered atraumatically. The umbilical cord was clamped x2 and cut and the infant was handed to the awaiting pediatricians. The placenta was removed intact and appeared normal, intact, and with a 3-vessel cord.   The uterus was exteriorized and cleared of all clot and debris. The hysterotomy was closed with running sutures of 0-Vicryl. A second imbricating layer was placed with the same suture. Excellent hemostasis was observed. The peritoneal cavity was cleared of all clots and debris. The uterus was returned to the abdomen.   The pelvis was irrigated and again, excellent hemostasis was noted. The fascia was then reapproximated with running sutures of 0 Vicryl.  The subcutaneous tissue was reapproximated with running sutures of 0 Vicry. The skin was reapproximated with Ensorb.  45ml (in 30 of 0.5% bupivicaine and 73ml of NSS) of liposomal bupivicaine placed in the fascial and skin lines.  Instrument, sponge, and needle counts were correct prior to the abdominal closure and at the conclusion of the case.   The patient tolerated the procedure well and was transferred to the recovery room in stable condition.   Benjaman Kindler, MD 06/08/2021

## 2021-06-08 NOTE — Progress Notes (Signed)
Labor Progress Note  Carolyn Munoz is a 31 y.o. G1P0 at [redacted]w[redacted]d by LMP admitted for induction of labor due to Post dates. Due date 05/30/2021.  Subjective: She is comfortable after having her epidural attended to. She feels occasional rectal pressure.  Objective: BP 107/63    Pulse 84    Temp 99.6 F (37.6 C) (Oral)    Resp 20    Ht 5\' 4"  (1.626 m)    Wt 62 kg    LMP 08/23/2020 (Exact Date) Comment: FHT in pre-op: 158   SpO2 96%    BMI 23.46 kg/m  Notable VS details: reviewed  Fetal Assessment: FHT:  FHR: 125 bpm, variability: moderate,  accelerations:  Present,  decelerations:  Absent Category/reactivity:  Category I UC:   regular, every 2 minutes MVUs: 150MVU SVE:   (by R. McVey CNM @ 0745) Dilation: 8cm  Effacement: 90%  Station:  0  Consistency: soft  Position: middle  Membrane status:AROM @ 1933 Amniotic color: clear  Labs: Lab Results  Component Value Date   WBC 7.4 06/07/2021   HGB 11.5 (L) 06/07/2021   HCT 33.3 (L) 06/07/2021   MCV 97.1 06/07/2021   PLT 165 06/07/2021    Assessment / Plan: Induction of labor due to postterm,  progressing well on pitocin  Labor:  Progressing well on Pitocin. IUPC in place for MVU assessment due to slow progression until 0100. She has had bloody show, feels occasional rectal pressure, and has made adequate change  . MVUs 140-150. Pitocin currently on 20 mu/min, will continue to titrate for adequate MVUs. Preeclampsia:  no signs or symptoms of toxicity Fetal Wellbeing:  Category I Pain Control:  Epidural I/D:   AROM x 13hrs, afebrile. GBS negative. Anticipated MOD:  NSVD  [name], CNM 06/08/2021, 8:59 AM

## 2021-06-08 NOTE — Progress Notes (Signed)
31yo G1P0 at [redacted]w[redacted]d with iol for late term, now with failure to progress beyond 8 cm. Significant molding to +2 station, Cat I. 8 cm at 0728, no further progress.  The risks of cesarean section discussed with the patient included but were not limited to: bleeding which may require transfusion or reoperation; infection which may require antibiotics; injury to bowel, bladder, ureters or other surrounding organs; injury to the fetus; need for additional procedures including hysterectomy in the event of a life-threatening hemorrhage; placental abnormalities wth subsequent pregnancies, incisional problems, thromboembolic phenomenon and other postoperative/anesthesia complications. The patient concurred with the proposed plan, giving informed written consent for the procedure.    Anesthesia and OR aware. Preoperative prophylactic antibiotics and SCDs ordered on call to the OR.  To OR when ready.

## 2021-06-08 NOTE — Progress Notes (Signed)
OBIX downtime from 3868 until 5488. Paper chart used for electronic charting of fetal heart rate and uterine activity.

## 2021-06-08 NOTE — Transfer of Care (Signed)
Immediate Anesthesia Transfer of Care Note  Patient: [place]  Procedure(s) Performed: CESAREAN SECTION  Patient Location: Mother/Baby  Anesthesia Type:Epidural  Level of Consciousness: drowsy  Airway & Oxygen Therapy: Patient Spontanous Breathing and Patient connected to face mask oxygen  Post-op Assessment: Report given to RN and Post -op Vital signs reviewed and stable  Post vital signs: Reviewed and stable  Last Vitals:  Vitals Value Taken Time  BP 135/92 06/08/21 1502  Temp    Pulse 85 06/08/21 1503  Resp 18 06/08/21 1503  SpO2 92 % 06/08/21 1503  Vitals shown include unvalidated device data.  Last Pain:  Vitals:   06/08/21 1137  TempSrc: Oral  PainSc:          Complications: No notable events documented.

## 2021-06-08 NOTE — Progress Notes (Signed)
Labor Progress Note  Carolyn Munoz is a 31 y.o. G1P0 at [redacted]w[redacted]d by LMP admitted for induction of labor due to Post dates. Due date 05/30/2021.  Subjective: she is resting in bed, reports more discomfort, reports occasional rectal pressure  Objective: BP 123/86    Pulse 83    Temp 98.6 F (37 C) (Oral)    Resp 18    Ht 5\' 4"  (1.626 m)    Wt 62 kg    LMP 08/23/2020 (Exact Date) Comment: FHT in pre-op: 158   SpO2 96%    BMI 23.46 kg/m  Notable VS details: reviewed  Fetal Assessment: FHT:  FHR: 130 bpm, variability: moderate,  accelerations:  Present,  decelerations:  Present early decels, recurrent Category/reactivity:  Category I UC:   regular, every 2-3 minutes MVUs: 100-150 SVE:    Dilation: 8.5cm  Effacement: 100%  Station:  +1  Consistency: soft  Position: middle  Membrane status:AROM @ 1933 Amniotic color: clear  Assessment / Plan: Induction of labor at 41.2 due to postdates.   Labor:  Progressing slowly  on pitocin, inadequate MVUs.  - She has had minimal urine output, RN reports 55ml urine since 0830. Urine is bloody. Bladder scan shows 50-194ml residual in bladder. CMP ordered. - She had a low-grade temperature of 99.8 axillary. No fetal tachycardia. CBC ordered to evaluate WBCs. No antibiotics at this time. - Discussed labor progress, elevated temperature, and decreased urine output with Dr. Leafy Ro who agrees with ordered labwork and reports to recheck pt in 1hr to evaluate for progress. Preeclampsia:  labs stable Fetal Wellbeing:  Category I Pain Control:  Epidural I/D:   Axillary temperature 99.8, no fetal tachycardia, no maternal tachycardia  Carolyn Munoz, CNM 06/08/2021, 12:12 PM

## 2021-06-08 NOTE — Lactation Note (Signed)
This note was copied from a baby's chart. Lactation Consultation Note  Patient Name: Carolyn Munoz [id number] Date: 06/08/2021 Reason for consult: Initial assessment;L&D Initial assessment;Primapara;1st time breastfeeding;Term Age:32 hours  Initial lactation visit in [place] <2hr post c-section for failure to progress. Mom did have epidural but received additional medications due to still feeling pain during c-section. Mom was in/out of sleep in the bed, still receiving oxygen; baby was starting to cue.  Transition asked for [facility] assistance with first feeding. LC adjusted mom's bed the best she could and provided support pillows for baby in a modified football position. LC notes before feeding started that mom has bilateral flat nipples that invert with compression. LC attempted several times to sustain latch; baby did latch on/off for 5 minutes. LC provided a nipple shield at this point, repositioned baby and he latched and sustained latch for almost 10 minutes, some colostrum in the shield when he fell asleep. Dad and paternal grandmother are support persons in the room. They both voiced concerns of baby receiving enough, and what to do if mom is not feeling well enough when baby becomes hungry again. We discussed different supplement options here in the beginning: Donor milk and formula- Dad open to donor milk for baby, LC was able to briefly talk to mom when she was awake and she confirmed this choice.  LC reviewed frequent feeding attempts of 8-12x in first 24 hours but stressed that baby may not eat every time and that is ok. Encouraged skin to skin with both mom and dad as able/appropriate. Reviewed early cues, feeding patterns, output expectations, and potential for baby to be spitty/gaggy due to c-section. Support persons verbalized understanding. Transition RN was updated; she plans to begin to thaw some donor milk and obtain consent as appropriate.   Maternal Data Has patient been taught Hand  Expression?:  (LC did hand expression; mom was asleep- drops) Does the patient have breastfeeding experience prior to this delivery?: No  Feeding Mother's Current Feeding Choice: Breast Milk  LATCH Score Latch: Repeated attempts needed to sustain latch, nipple held in mouth throughout feeding, stimulation needed to elicit sucking reflex.  Audible Swallowing: A few with stimulation  Type of Nipple: Inverted  Comfort (Breast/Nipple): Soft / non-tender  Hold (Positioning): Full assist, staff holds infant at breast (mom asleep from medicine)  LATCH Score: 4   Lactation Tools Discussed/Used Tools: Nipple Shields Nipple shield size: 20  Interventions Interventions: Breast feeding basics reviewed;Assisted with latch;Hand express;Adjust position;Support pillows;Education (nipple shield; donor milk)  Discharge    Consult Status Consult Status: Follow-up Date: 06/09/21 Follow-up type: In-patient    Carolyn Munoz 06/08/2021, 4:20 PM

## 2021-06-08 NOTE — Progress Notes (Signed)
SVE: 7-8/90/0 with molding noted, no cervical edema noted.   IUPC placed to better assess uterine contractile strength.   Francetta Found, CNM 06/08/2021 6:32 AM

## 2021-06-09 ENCOUNTER — Encounter: Payer: Self-pay | Admitting: Obstetrics and Gynecology

## 2021-06-09 ENCOUNTER — Inpatient Hospital Stay: Payer: BC Managed Care – PPO

## 2021-06-09 LAB — URINALYSIS, ROUTINE W REFLEX MICROSCOPIC
Bacteria, UA: NONE SEEN
RBC / HPF: >50 RBC/hpf — ABNORMAL HIGH (ref 0–5)
Specific Gravity, Urine: 1.012 (ref 1.005–1.030)
WBC, UA: >50 WBC/hpf — ABNORMAL HIGH (ref 0–5)

## 2021-06-09 LAB — CBC
HCT: 29.4 % — ABNORMAL LOW (ref 36.0–46.0)
Hemoglobin: 10 g/dL — ABNORMAL LOW (ref 12.0–15.0)
MCH: 33.4 pg (ref 26.0–34.0)
MCHC: 34 g/dL (ref 30.0–36.0)
MCV: 98.3 fL (ref 80.0–100.0)
Platelets: 168 10*3/uL (ref 150–400)
RBC: 2.99 MIL/uL — ABNORMAL LOW (ref 3.87–5.11)
RDW: 14.5 % (ref 11.5–15.5)
WBC: 13.6 10*3/uL — ABNORMAL HIGH (ref 4.0–10.5)
nRBC: 0 % (ref 0.0–0.2)

## 2021-06-09 LAB — COMPREHENSIVE METABOLIC PANEL
ALT: 15 U/L (ref 0–44)
AST: 31 U/L (ref 15–41)
Albumin: 2.4 g/dL — ABNORMAL LOW (ref 3.5–5.0)
Alkaline Phosphatase: 105 U/L (ref 38–126)
Anion gap: 9 (ref 5–15)
BUN: 21 mg/dL — ABNORMAL HIGH (ref 6–20)
CO2: 19 mmol/L — ABNORMAL LOW (ref 22–32)
Calcium: 8.4 mg/dL — ABNORMAL LOW (ref 8.9–10.3)
Chloride: 106 mmol/L (ref 98–111)
Creatinine, Ser: 1.1 mg/dL — ABNORMAL HIGH (ref 0.44–1.00)
GFR, Estimated: >60 mL/min (ref >60)
Glucose, Bld: 98 mg/dL (ref 70–99)
Potassium: 4.4 mmol/L (ref 3.5–5.1)
Sodium: 134 mmol/L — ABNORMAL LOW (ref 135–145)
Total Bilirubin: 0.7 mg/dL (ref 0.3–1.2)
Total Protein: 4.9 g/dL — ABNORMAL LOW (ref 6.5–8.1)

## 2021-06-09 LAB — PROTEIN / CREATININE RATIO, URINE
Creatinine, Urine: 182 mg/dL
Protein Creatinine Ratio: 0.46 mg/mg{Cre} — ABNORMAL HIGH (ref 0.00–0.15)
Total Protein, Urine: 84 mg/dL

## 2021-06-09 IMAGING — US US RENAL
1 series · 14 of 25 positions shown · non-contrast
Comparison: None.

CLINICAL DATA: Decreased urinary output.  C-section yesterday.

EXAM:
RENAL / URINARY TRACT ULTRASOUND COMPLETE

[Series 1: us renal · 14 of 35 slices shown]
[im 1/35]
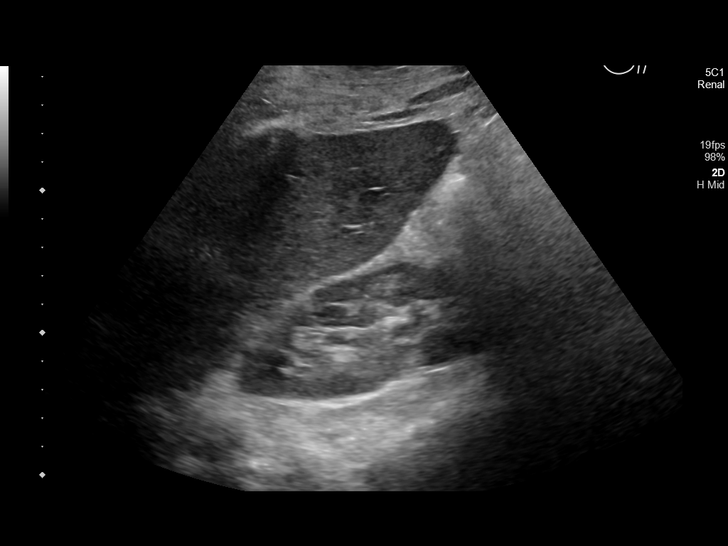
[im 3/35]
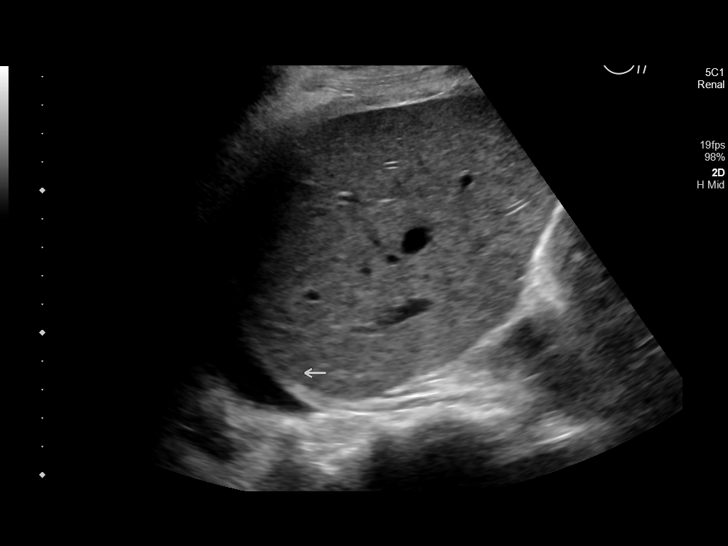
[im 6/35]
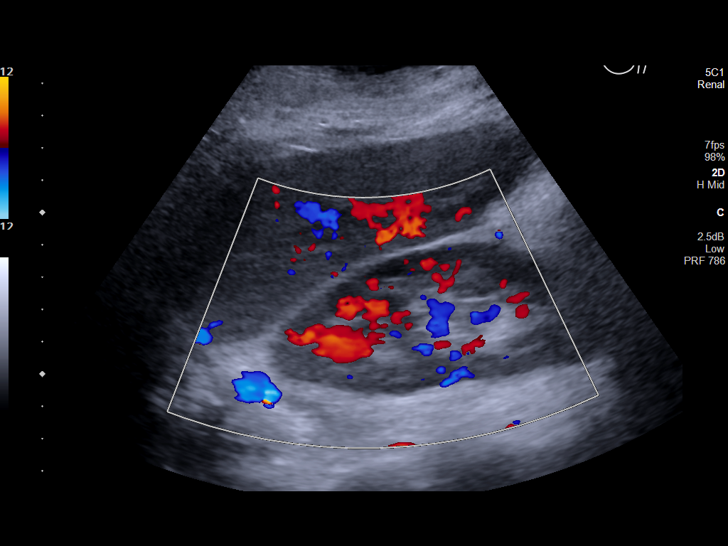
[im 9/35]
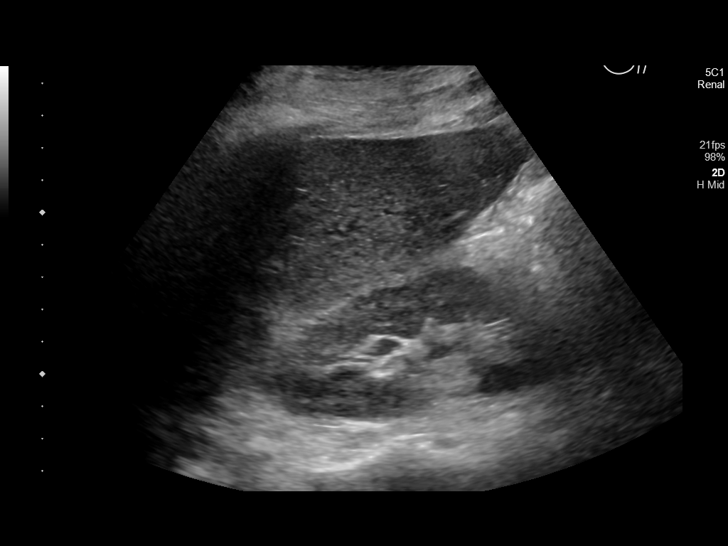
[im 12/35]
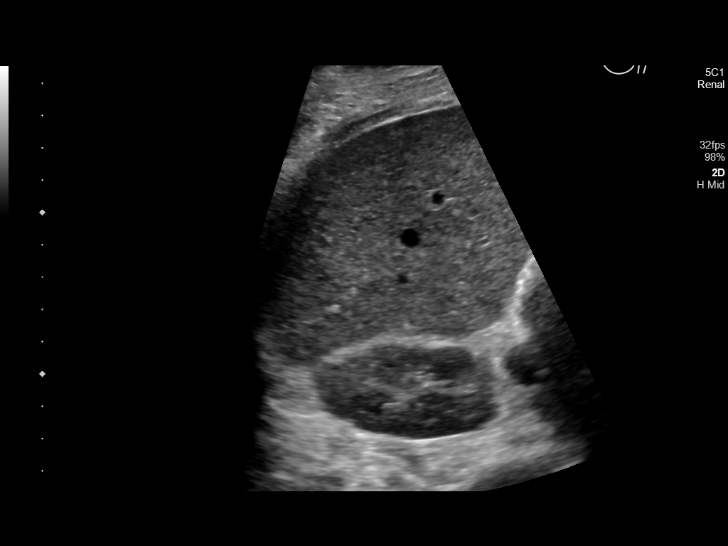
[im 13/35]
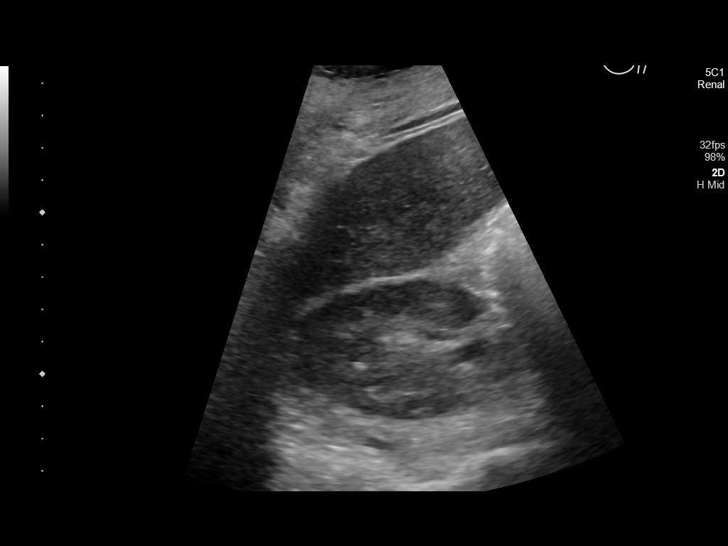
[im 16/35]
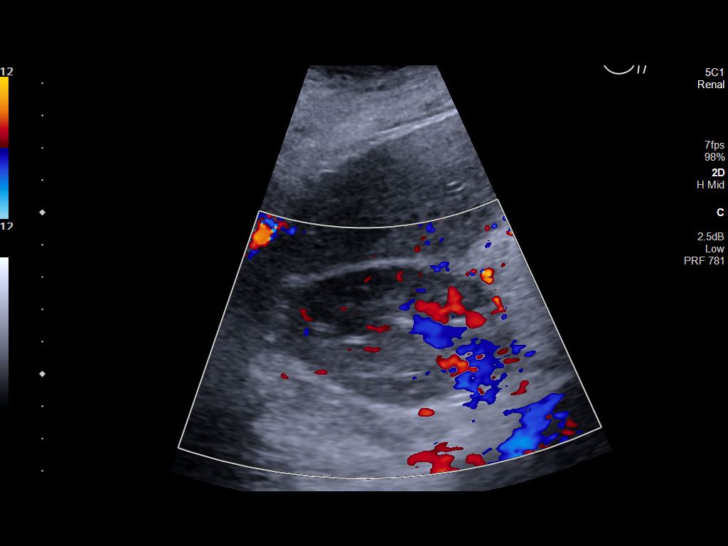
[im 19/35]
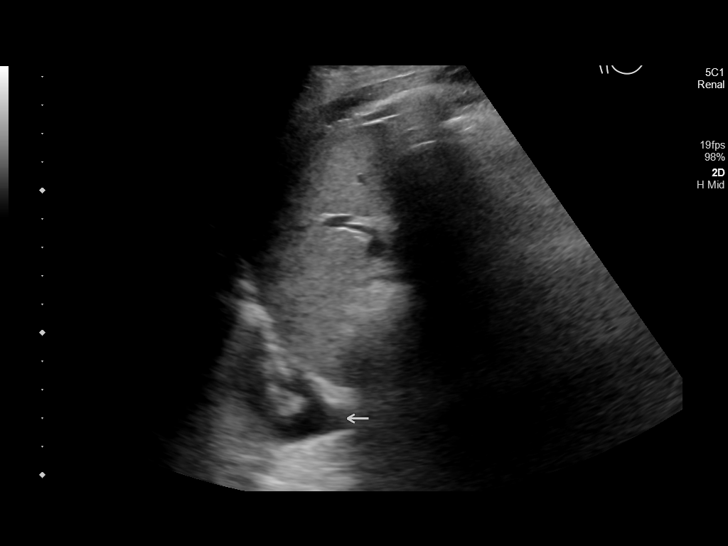
[im 22/35]
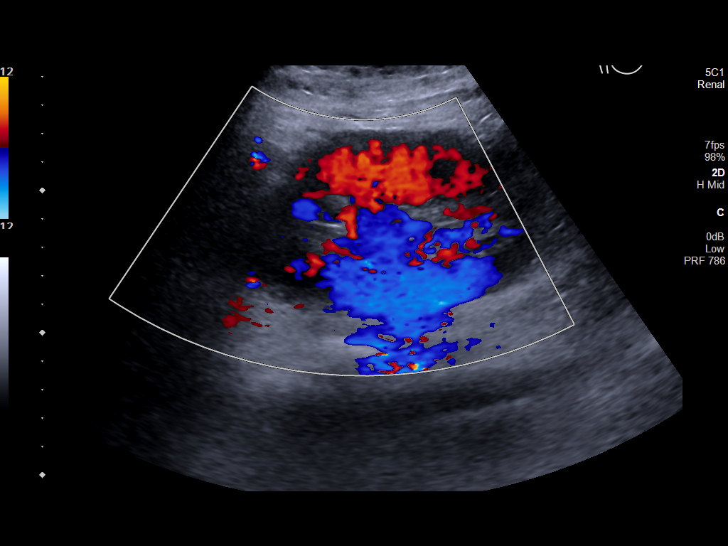
[im 23/35]
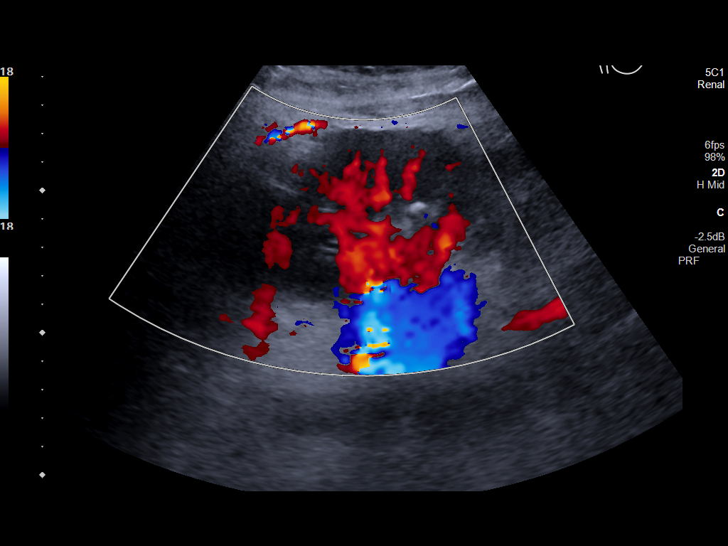
[im 26/35]
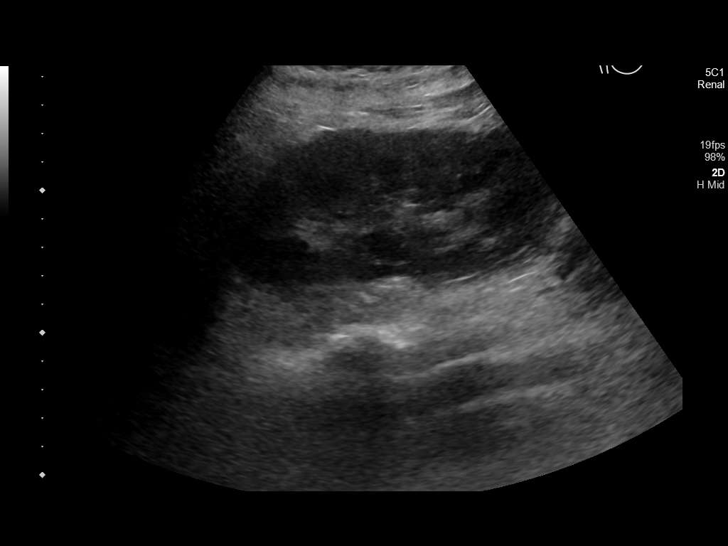
[im 29/35]
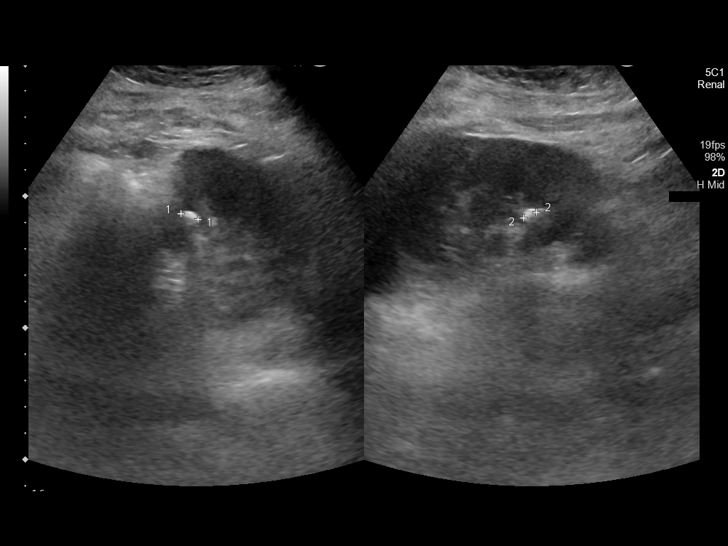
[im 32/35]
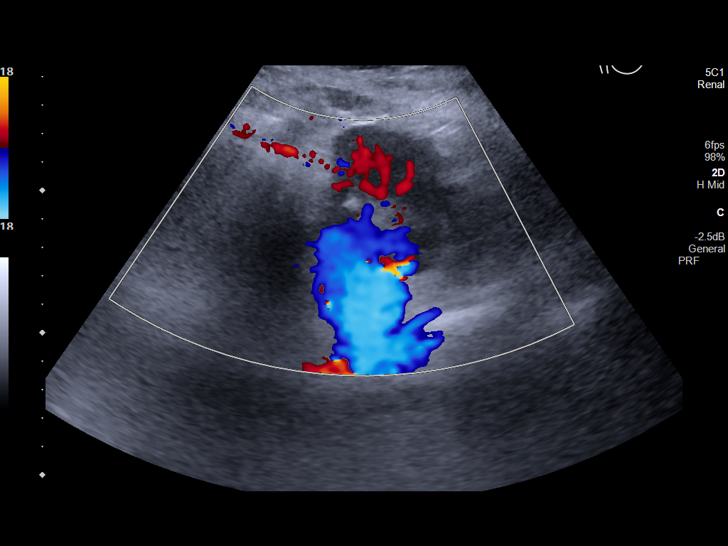
[im 35/35]
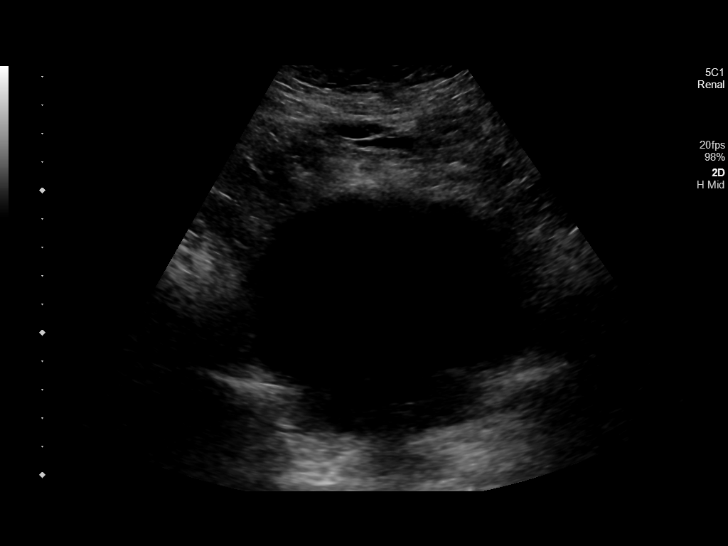

[14 of 25 positions shown; findings below may reference images not displayed]

FINDINGS: Right Kidney:

Renal measurements: 10.9 x 4 x 6 cm = volume: 136 mL. Echogenicity
within normal limits. No mass or hydronephrosis visualized.

Left Kidney:

Renal measurements: 11.6 x 5.3 x 4.9 cm = volume: 156 mL.
Echogenicity within normal limits. There is a 7 mm stone. No mass or
hydronephrosis visualized.

Urinary bladder:

Appears normal for degree of bladder distention.

Other:

Bilateral trace pleural effusions.
IMPRESSION: 1. Nonobstructive 7 mm left nephrolithiasis.
2. Bilateral trace pleural effusions.

## 2021-06-09 MED ORDER — HYDROMORPHONE HCL 1 MG/ML IJ SOLN: 0.5000 mg | INTRAMUSCULAR | Status: DC | PRN

## 2021-06-09 NOTE — Anesthesia Postprocedure Evaluation (Signed)
Anesthesia Post Note  Patient: Carolyn Munoz  Procedure(s) Performed: Force  Patient location during evaluation: Mother Baby Anesthesia Type: Epidural Level of consciousness: awake and alert, oriented and responds to stimulation Pain management: pain level controlled Vital Signs Assessment: post-procedure vital signs reviewed and stable Respiratory status: spontaneous breathing, nonlabored ventilation and respiratory function stable Cardiovascular status: stable Postop Assessment: no headache, no backache, epidural receding and able to ambulate Anesthetic complications: no   No notable events documented.   Last Vitals:  Vitals:   06/09/21 0648 06/09/21 0819  BP:  100/64  Pulse:  73  Resp:  20  Temp:  36.6 C  SpO2: 95% 97%    Last Pain:  Vitals:   06/09/21 0819  TempSrc: Oral  PainSc:                  Caryl Asp

## 2021-06-09 NOTE — Progress Notes (Signed)
1 Day Post-Op       Procedure(s): CESAREAN SECTION Subjective: The patient is doing well.  No nausea or vomiting. Pain is adequately controlled. Does not think leg edema is worse than prior to delivery  Objective: Vital signs in last 24 hours: Temp:  [97.8 F (36.6 C)-99 F (37.2 C)] 97.8 F (36.6 C) (01/04 0819) Pulse Rate:  [60-74] 73 (01/04 0819) Resp:  [18-20] 20 (01/04 0819) BP: (100-125)/(64-81) 100/64 (01/04 0819) SpO2:  [92 %-97 %] 97 % (01/04 0819)  Intake/Output  Intake/Output Summary (Last 24 hours) at 06/09/2021 1750 Last data filed at 06/09/2021 1700 Gross per 24 hour  Intake 481.63 ml  Output 771 ml  Net -289.37 ml    Physical Exam:  General: Alert and oriented. CV: RRR Lungs: Clear bilaterally. L>R decreased at base but no crackles GI: Soft, Nondistended. Appropriately tender to palpation, no concerns for intraperitoneal infection or blood collection Incisions: Clean and dry under honeycomb Urine: Clear Extremities: Nontender, no erythema, 1+ pitting edema to mid-tibia  Lab Results: Recent Labs    06/07/21 0600 06/08/21 1042 06/09/21 0605  HGB 11.5* 11.6* 10.0*  HCT 33.3* 35.4* 29.4*  WBC 7.4 15.4* 13.6*  PLT 165 172 168                 Results for orders placed or performed during the hospital encounter of 06/07/21 (from the past 24 hour(s))  CBC     Status: Abnormal   Collection Time: 06/09/21  6:05 AM  Result Value Ref Range   WBC 13.6 (H) 4.0 - 10.5 K/uL   RBC 2.99 (L) 3.87 - 5.11 MIL/uL   Hemoglobin 10.0 (L) 12.0 - 15.0 g/dL   HCT 29.4 (L) 36.0 - 46.0 %   MCV 98.3 80.0 - 100.0 fL   MCH 33.4 26.0 - 34.0 pg   MCHC 34.0 30.0 - 36.0 g/dL   RDW 14.5 11.5 - 15.5 %   Platelets 168 150 - 400 K/uL   nRBC 0.0 0.0 - 0.2 %  Comprehensive metabolic panel     Status: Abnormal   Collection Time: 06/09/21  8:15 AM  Result Value Ref Range   Sodium 134 (L) 135 - 145 mmol/L   Potassium 4.4 3.5 - 5.1 mmol/L   Chloride 106 98 - 111 mmol/L   CO2 19 (L)  22 - 32 mmol/L   Glucose, Bld 98 70 - 99 mg/dL   BUN 21 (H) 6 - 20 mg/dL   Creatinine, Ser 1.10 (H) 0.44 - 1.00 mg/dL   Calcium 8.4 (L) 8.9 - 10.3 mg/dL   Total Protein 4.9 (L) 6.5 - 8.1 g/dL   Albumin 2.4 (L) 3.5 - 5.0 g/dL   AST 31 15 - 41 U/L   ALT 15 0 - 44 U/L   Alkaline Phosphatase 105 38 - 126 U/L   Total Bilirubin 0.7 0.3 - 1.2 mg/dL   GFR, Estimated >60 >60 mL/min   Anion gap 9 5 - 15    Assessment/Plan: 1 Day Post-Op       Procedure(s): CESAREAN SECTION   - AKI: Appreciate nephrology involvement. 157ml/hr of LR running. Plan to repeat CMP/CBC in am - Low urine output: two elevated BP in postop recovery, but in pain. Normal BP since. Will get UA and protein:creatinine. Her plts and liver enzymes wnl. -Ambulate, Incentive spirometry. Encourage ambulation with iv pain meds prn, but she hasn't had po pain meds today so we will start then. -Normal diet as tolerated  Benjaman Kindler, MD   LOS: 2 days   Benjaman Kindler 06/09/2021, 5:50 PM

## 2021-06-09 NOTE — Lactation Note (Signed)
This note was copied from a baby's chart. Lactation Consultation Note  Patient Name: Carolyn Munoz [id number] Date: 06/09/2021 Reason for consult: Follow-up assessment;Term;Primapara;Other (Comment) (c-section) Age:32 hours  Lactation support for feeding. Mom remains tired and weak but willing and eager to support and feed baby. Mom, dad and paternal grandmother present and helpful providing support and positioning of baby and pillows. [place] student brought baby to L breast with nipple shield, accepted easily and had rhythmic sucking pattern for 5 minutes. Baby was then transferred easily to R breast, again with nipple shield, strong rhythmic sucking pattern and baby more active on the R side. Baby remained active for 5 minutes. At ending of feeding, LC warmed and gave 61mL to paternal grandmother for feeding.  [facility] student brought in supplies for set-up, education, and use of DEBP at bedside for additional support and breast stimulation. [place] team reiterated the importance going forward of baby at breast for each feeding and each feeding attempt, supplement provided as needed, and mom to pump minimum of every 3 hours.  Support persons and MOB voice understanding of plan.  Maternal Data Has patient been taught Hand Expression?: Yes Does the patient have breastfeeding experience prior to this delivery?: No  Feeding Mother's Current Feeding Choice: Breast Milk Nipple Type: Slow - flow  LATCH Score Latch: Grasps breast easily, tongue down, lips flanged, rhythmical sucking.  Audible Swallowing: A few with stimulation  Type of Nipple: Flat  Comfort (Breast/Nipple): Soft / non-tender  Hold (Positioning): Assistance needed to correctly position infant at breast and maintain latch.  LATCH Score: 7   Lactation Tools Discussed/Used Tools: Pump Nipple shield size: 20 Breast pump type: Double-Electric Breast Pump Pump Education: Setup, frequency, and cleaning;Milk Storage Reason for Pumping:  additional stimulation Pumping frequency: q 3 hrs/post feeding  Interventions Interventions: DEBP;Education  Discharge Pump: Personal  Consult Status Consult Status: Follow-up Date: 06/09/21 Follow-up type: In-patient    Lavonia Drafts 06/09/2021, 3:21 PM

## 2021-06-09 NOTE — Progress Notes (Signed)
Postop Day  1  Subjective: Resting in bed, was able to rest last night   Doing well, no concerns. Pain managed with PO/IV meds.  Having nausea and vomiting since trying regular diet.  Has not ambulated much since last night.   No fever/chills, chest pain, shortness of breath, or leg pain. No nipple or breast pain. No headache, visual changes, or RUQ/epigastric pain.  Objective: BP 100/64 (BP Location: Right Arm)    Pulse 73    Temp 97.8 F (36.6 C) (Oral)    Resp 20    Ht 5\' 4"  (1.626 m)    Wt 62 kg    LMP 08/23/2020 (Exact Date) Comment: FHT in pre-op: 158   SpO2 97%    Breastfeeding Unknown    BMI 23.46 kg/m    Physical Exam:  General: alert, cooperative, and no distress Breasts: soft/nontender CV: RRR Pulm: nl effort, CTABL Abdomen: soft, non-tender, active bowel sounds Uterine Fundus: firm Incision: no significant drainage, covered with occlusive dressing  Perineum: minimal edema, intact Lochia: appropriate DVT Evaluation: No evidence of DVT seen on physical exam.   Intake/Output Summary (Last 24 hours) at 06/09/2021 0855 Last data filed at 06/09/2021 0615 Gross per 24 hour  Intake 2805.56 ml  Output 1691 ml  Net 1114.56 ml    Recent Labs    06/08/21 1042 06/09/21 0605  HGB 11.6* 10.0*  HCT 35.4* 29.4*  WBC 15.4* 13.6*  PLT 172 168   CMP Latest Ref Rng & Units 06/09/2021 06/08/2021 12/18/2020  Glucose 70 - 99 mg/dL 98 89 93  BUN 6 - 20 mg/dL 21(H) 13 9  Creatinine 0.44 - 1.00 mg/dL 1.10(H) 1.07(H) 0.51  Sodium 135 - 145 mmol/L 134(L) 133(L) 135  Potassium 3.5 - 5.1 mmol/L 4.4 4.1 4.0  Chloride 98 - 111 mmol/L 106 106 108  CO2 22 - 32 mmol/L 19(L) 18(L) 24  Calcium 8.9 - 10.3 mg/dL 8.4(L) 8.5(L) 8.9  Total Protein 6.5 - 8.1 g/dL 4.9(L) 5.8(L) -  Total Bilirubin 0.3 - 1.2 mg/dL 0.7 1.0 -  Alkaline Phos 38 - 126 U/L 105 146(H) -  AST 15 - 41 U/L 31 25 -  ALT 0 - 44 U/L 15 19 -     Assessment/Plan: 31 y.o. G1P1001 postpartum day # 1  -Continue routine postpartum  care -Lactation consult PRN for breastfeeding   -Acute blood loss anemia - hemodynamically stable and asymptomatic; start PO ferrous sulfate BID with stool softeners  -Urine output adequate between 30-50 ml/hr prior to foley removal -Due to void by noon today  -Continue to monitor I/O q 4hours  -Repeat CBC and CMP in AM  -Up to shower today - > change dressing to OP site occlusive honeycomb dressing  -Immunization status:   all immunizations up to date   Disposition: Continue inpatient postpartum care   LOS: 2 days   Minda Meo, CNM 06/09/2021, 8:52 AM   ----- Drinda Butts  Certified Nurse Midwife [facility]

## 2021-06-09 NOTE — Anesthesia Post-op Follow-up Note (Signed)
°  Anesthesia Pain Follow-up Note  Patient: Carolyn Munoz  Day #: 1  Date of Follow-up: 06/09/2021 Time: 8:33 AM  Last Vitals:  Vitals:   06/09/21 0648 06/09/21 0819  BP:  100/64  Pulse:  73  Resp:  20  Temp:  36.6 C  SpO2: 95% 97%    Level of Consciousness: alert  Pain: none   Side Effects:None  Catheter Site Exam:clean, dry, no drainage     Plan: D/C from anesthesia care at surgeon's request  Caryl Asp

## 2021-06-09 NOTE — Consult Note (Signed)
[facility] Consult Note: June 09, 2021    Date of Admission:  06/07/2021           Reason for Consult: Acute kidney injury    Referring Provider: Benjaman Kindler, MD Primary Care Provider: Maryland Pink, MD   History of Presenting Illness:  Carolyn Munoz is a 31 y.o. female  Patient had a C-section yesterday.  Postoperative, decreased urine output was noted.  Baseline creatinine of 0.51 which has increased to 1.07->1.10.  Nephrology consult is now requested out of concern of decreased urine output. Intraoperatively, 900 cc blood loss was noted. Blood pressure mostly in the 130-140 range. Today's blood pressure relatively low at 100/64 Review of medications: Toradol, ibuprofen, fleets enema noted on the medication list. Review of MAR: Toradol injection administered intravenous on January 3 @ 1537, 2157,  AM dose was held  Review of Systems: ROS Gen: Denies any fevers or chills HEENT: No vision or hearing problems CV: No chest pain or shortness of breath Resp: No cough or sputum production GI: nauseas post surgery. Was able to eat some chicken nuggets today GU : No problems with voiding.  No hematuria.  No previous history of kidney problems MS:  Denies any acute joint pain or swelling Derm:   No complaints Psych: No complaints Heme: No complaints Neuro: No complaints Endocrine: No complaints   Past Medical History:  Diagnosis Date   Anxiety    Asthma    age teens to age 7   Bicuspid aortic valve    Complex ovarian cyst    Depression     Social History   Tobacco Use   Smoking status: Never   Smokeless tobacco: Never  Vaping Use   Vaping Use: Never used  Substance Use Topics   Alcohol use: Not Currently   Drug use: Never    Family History  Problem Relation Age of Onset   Heart disease Mother    Breast cancer Mother      OBJECTIVE: Blood pressure 100/64, pulse 73, temperature 97.8 F (36.6 C), temperature source Oral, resp.  rate 20, height 5\' 4"  (1.626 m), weight 62 kg, last menstrual period 08/23/2020, SpO2 97 %, unknown if currently breastfeeding.  Physical Exam Physical Exam: General:  No acute distress, laying in the bed  HEENT  anicteric, moist oral mucous membrane  Pulm/lungs  normal breathing effort, lungs are clear to auscultation  CVS/Heart  regular rhythm, no rub or gallop  Abdomen:   Soft, nontender  Extremities:  + dependent peripheral edema  Neurologic:  Alert, oriented, able to follow commands  Skin:  No acute rashes     Lab Results Lab Results  Component Value Date   WBC 13.6 (H) 06/09/2021   HGB 10.0 (L) 06/09/2021   HCT 29.4 (L) 06/09/2021   MCV 98.3 06/09/2021   PLT 168 06/09/2021    Lab Results  Component Value Date   CREATININE 1.10 (H) 06/09/2021   BUN 21 (H) 06/09/2021   NA 134 (L) 06/09/2021   K 4.4 06/09/2021   CL 106 06/09/2021   CO2 19 (L) 06/09/2021    Lab Results  Component Value Date   ALT 15 06/09/2021   AST 31 06/09/2021   ALKPHOS 105 06/09/2021   BILITOT 0.7 06/09/2021     Microbiology: Recent Results (from the past 240 hour(s))  SARS CORONAVIRUS 2 (TAT 6-24 HRS) Nasopharyngeal Nasopharyngeal Swab     Status: None   Collection Time: 06/04/21  9:21 AM   Specimen:  Nasopharyngeal Swab  Result Value Ref Range Status   SARS Coronavirus 2 NEGATIVE NEGATIVE Final    Comment: (NOTE) SARS-CoV-2 target nucleic acids are NOT DETECTED.  The SARS-CoV-2 RNA is generally detectable in upper and lower respiratory specimens during the acute phase of infection. Negative results do not preclude SARS-CoV-2 infection, do not rule out co-infections with other pathogens, and should not be used as the sole basis for treatment or other patient management decisions. Negative results must be combined with clinical observations, patient history, and epidemiological information. The expected result is Negative.  Fact Sheet for  Patients: SugarRoll.be  Fact Sheet for Healthcare Providers: https://www.woods-mathews.com/  This test is not yet approved or cleared by the [place] FDA and  has been authorized for detection and/or diagnosis of SARS-CoV-2 by FDA under an Emergency Use Authorization (EUA). This EUA will remain  in effect (meaning this test can be used) for the duration of the COVID-19 declaration under Se ction 564(b)(1) of the Act, 21 U.S.C. section 360bbb-3(b)(1), unless the authorization is terminated or revoked sooner.  Performed at [facility], [place] [address], [place], [place] [address]     Medications: Scheduled Meds:  acetaminophen  1,000 mg Oral Q6H   ferrous sulfate  325 mg Oral BID WC   gabapentin  300 mg Oral QHS   measles, mumps & rubella vaccine  0.5 mL Subcutaneous Once   prenatal multivitamin  1 tablet Oral Q1200   scopolamine  1 patch Transdermal Once   senna-docusate  2 tablet Oral Q24H   simethicone  80 mg Oral TID PC   Tdap  0.5 mL Intramuscular Once   Continuous Infusions:  lactated ringers 125 mL/hr at 06/09/21 0617   naLOXone [facility]) adult infusion for PRURITIS     PRN Meds:.bisacodyl, coconut oil, witch hazel-glycerin **AND** dibucaine, diphenhydrAMINE **OR** diphenhydrAMINE, diphenhydrAMINE, menthol-cetylpyridinium, naloxone **AND** sodium chloride flush, naLOXone (NARCAN) adult infusion for PRURITIS, ondansetron (ZOFRAN) IV, oxyCODONE, oxyCODONE, simethicone  Allergies  Allergen Reactions   Banana     Abdominal Pain    Urinalysis: No results for input(s): COLORURINE, LABSPEC, PHURINE, GLUCOSEU, HGBUR, BILIRUBINUR, KETONESUR, PROTEINUR, UROBILINOGEN, NITRITE, LEUKOCYTESUR in the last 72 hours.  Invalid input(s): APPERANCEUR    Imaging: No results found.    Assessment/Plan:  Carolyn Munoz is a 31 y.o. female with recent C-section on January 3  was admitted on 06/07/2021 for :  Post-dates pregnancy  [O48.0]  AKI Baseline creatinine of 0.51 from December 18, 2020. Admission creatinine of 1.  07 from January 3 Today's creatinine slightly higher at 1.10  Recommendations: Obtain urinalysis, renal ultrasound Avoid nonsteroidals.  Consider low-dose opioids for pain control Avoid phosphorus containing enemas Avoid hypotension Agree with IV fluid supplementation until oral intake gets back to normal  LE edema Developed during pregnancy Will monitor     Carolyn Munoz 06/09/21

## 2021-06-10 DIAGNOSIS — Z98891 History of uterine scar from previous surgery: Secondary | ICD-10-CM

## 2021-06-10 DIAGNOSIS — O1495 Unspecified pre-eclampsia, complicating the puerperium: Secondary | ICD-10-CM

## 2021-06-10 LAB — CBC
HCT: 25.6 % — ABNORMAL LOW (ref 36.0–46.0)
Hemoglobin: 8.6 g/dL — ABNORMAL LOW (ref 12.0–15.0)
MCH: 33.1 pg (ref 26.0–34.0)
MCHC: 33.6 g/dL (ref 30.0–36.0)
MCV: 98.5 fL (ref 80.0–100.0)
Platelets: 169 10*3/uL (ref 150–400)
RBC: 2.6 MIL/uL — ABNORMAL LOW (ref 3.87–5.11)
RDW: 14.6 % (ref 11.5–15.5)
WBC: 10.3 10*3/uL (ref 4.0–10.5)
nRBC: 0 % (ref 0.0–0.2)

## 2021-06-10 LAB — COMPREHENSIVE METABOLIC PANEL
ALT: 14 U/L (ref 0–44)
AST: 25 U/L (ref 15–41)
Albumin: 2.1 g/dL — ABNORMAL LOW (ref 3.5–5.0)
Alkaline Phosphatase: 84 U/L (ref 38–126)
Anion gap: 6 (ref 5–15)
BUN: 18 mg/dL (ref 6–20)
CO2: 23 mmol/L (ref 22–32)
Calcium: 7.8 mg/dL — ABNORMAL LOW (ref 8.9–10.3)
Chloride: 111 mmol/L (ref 98–111)
Creatinine, Ser: 0.61 mg/dL (ref 0.44–1.00)
GFR, Estimated: >60 mL/min (ref >60)
Glucose, Bld: 80 mg/dL (ref 70–99)
Potassium: 4 mmol/L (ref 3.5–5.1)
Sodium: 140 mmol/L (ref 135–145)
Total Bilirubin: 0.5 mg/dL (ref 0.3–1.2)
Total Protein: 4.5 g/dL — ABNORMAL LOW (ref 6.5–8.1)

## 2021-06-10 MED ORDER — OXYCODONE HCL 5 MG PO TABS: 5.0000 mg | ORAL_TABLET | Freq: Four times a day (QID) | ORAL | 0 refills | Status: DC | PRN

## 2021-06-10 MED ORDER — ACETAMINOPHEN 500 MG PO TABS: 1000.0000 mg | ORAL_TABLET | Freq: Four times a day (QID) | ORAL | Status: DC

## 2021-06-10 MED ORDER — FERROUS SULFATE 325 (65 FE) MG PO TABS: 325.0000 mg | ORAL_TABLET | Freq: Two times a day (BID) | ORAL | Status: DC

## 2021-06-10 NOTE — Progress Notes (Signed)
Patient discharged home with family.  Discharge instructions, when to follow up, and prescriptions reviewed with patient.  Patient verbalized understanding. Patient will be escorted out by auxiliary.   

## 2021-06-10 NOTE — Lactation Note (Signed)
This note was copied from a baby's chart. Lactation Consultation Note  Patient Name: Carolyn Munoz HKGOV'P Date: 06/10/2021 Reason for consult: Follow-up assessment;Primapara;Term;Other (Comment) (c-section) Age:32 hours  Lactation follow-up prior to anticipated discharge later today. Increased feedings at the breast overnight, frequency and length. Parents were also encouraged this morning to switch supplementation feedings to formula to ensure tolerance before discharge. Baby has had 1 bottle and did spit-up post bath. Monitoring closely. [place] talked with parents about feeding plan for discharge. They have decided on: -Baby at breast first with each feeding -Offering of supplement PRN w/ appropriate volumes for age -Mom utilizing pump post feedings for additional stimulation LC and parents discussed anticipated breast changes, management of breast fullness/engorgement and nipple care. Dicussed changes in feeding patterns and behaviors, options of giving EBM as supplement post feedings at breast once volume increases.  Outpatient lactation service information given along with community breastfeeding support. Encouraged parents to call out with any concerns/questions and for ongoing support as needed.  Maternal Data Has patient been taught Hand Expression?: Yes Does the patient have breastfeeding experience prior to this delivery?: No  Feeding Mother's Current Feeding Choice: Breast Milk Nipple Type: Slow - flow  LATCH Score                    Lactation Tools Discussed/Used Tools: Nipple Shields Nipple shield size: 20 Breast pump type: Double-Electric Breast Pump Reason for Pumping: stimulation  Interventions Interventions: Breast feeding basics reviewed;Hand express;Reverse pressure;Pre-pump if needed;DEBP;Ice;Education;Pace feeding  Discharge Discharge Education: Engorgement and breast care;Warning signs for feeding baby;Outpatient recommendation Pump:  Personal  Consult Status Consult Status: Complete Date: 06/10/21 Follow-up type: Call as needed    Lavonia Drafts 06/10/2021, 10:59 AM

## 2021-06-10 NOTE — TOC Initial Note (Signed)
Transition of Care [facility]) - Initial/Assessment Note    Patient Details  Name: Carolyn Munoz MRN: [id number] Date of Birth: 11/04/1990  [facility] CM/SW Contact:    Anselm Pancoast, RN Phone Number: 06/10/2021, 11:01 AM  Clinical Narrative:                 Spoke with patient and husband regarding edinburgh scores. Patient reports she has strong support system with her husband and her mother in law but has been feeling anxious due to delivery not going as planned and ending up with Csection. Reports she has great relationship with her family and her OB who she feels comfortable reaching out to if needed. Discussed PPD and resources if needed. Patient is planning to breastfeed and already has her pump at home. Has all needed equipment and will not work outside the home. No issues with transportation. No other needs or concerns.         Patient Goals and CMS Choice        Expected Discharge Plan and Services                                                Prior Living Arrangements/Services                       Activities of Daily Living Home Assistive Devices/Equipment: None ADL Screening (condition at time of admission) Patient's cognitive ability adequate to safely complete daily activities?: Yes Is the patient deaf or have difficulty hearing?: No Does the patient have difficulty seeing, even when wearing glasses/contacts?: No Does the patient have difficulty concentrating, remembering, or making decisions?: No Patient able to express need for assistance with ADLs?: Yes Does the patient have difficulty dressing or bathing?: No Independently performs ADLs?: Yes (appropriate for developmental age) Does the patient have difficulty walking or climbing stairs?: No Weakness of Legs: None Weakness of Arms/Hands: None  Permission Sought/Granted                  Emotional Assessment              Admission diagnosis:  Post-dates pregnancy  [O48.0] Patient Active Problem List   Diagnosis Date Noted   S/P cesarean section 06/10/2021   Preeclampsia in postpartum period 06/10/2021   Ovarian cyst, bilateral 12/17/2020   PCP:  [place] Pink, MD Pharmacy:   [id number] - [place], [place] [address] [place] [address] Phone: [phone] Fax: [phone]     Social Determinants of Health (SDOH) Interventions    Readmission Risk Interventions No flowsheet data found.

## 2021-06-10 NOTE — Discharge Summary (Signed)
Obstetrical Discharge Summary  Patient Name: Carolyn Munoz DOB: 08-Nov-1990 MRN: [id number]  Date of Admission: 06/07/2021 Date of Delivery: 06/08/21 Delivered by: Dr. Leafy Ro Date of Discharge: 06/10/2021  Primary OB: [facility] JOI:NOMVEHM'C last menstrual period was 08/23/2020 (exact date). EDC Estimated Date of Delivery: 05/30/21 Gestational Age at Delivery: [redacted]w[redacted]d   Antepartum complications:  Scoliosis Anemia Bicuspid aortic valve Bilateral dermoid cysts  Asthma  History of anxiety/depression Admitting Diagnosis: Scheduled IOL for post-dates Secondary Diagnosis: Patient Active Problem List   Diagnosis Date Noted   S/P cesarean section 06/10/2021   Preeclampsia in postpartum period 06/10/2021   Ovarian cyst, bilateral 12/17/2020    Augmentation: AROM, Pitocin, and Cytotec Complications: Arrest of dilation    Intrapartum complications/course:  Delivery Type: primary cesarean section, low transverse incision Anesthesia: epidural Placenta: spontaneous Laceration: none Episiotomy: none Newborn Data: Live born female  Birth Weight: 8 lb 12 oz (3970 g) APGAR: 8, 9  Newborn Delivery   Birth date/time: 06/08/2021 13:46:00 Delivery type: C-Section, Low Vertical Trial of labor: Yes C-section categorization: Primary      31 y.o. G1P1001 at [redacted]w[redacted]d presenting for IOL for post-dates. Induction with Cytotec, Pitocin and AROM. Proceeded to c/s for arrest of dilation   Postpartum Procedures: Nephrology consult for abnormal labs and low output. Output improved over night and labs stablized. Kidney stone found incidentally. PCR 0.46 with third spacing more likely indicates Pre-E.  Post partum course:  By time of discharge on POD#2, her pain was controlled on oral pain medications; she had appropriate lochia and was ambulating, voiding without difficulty, tolerating regular diet and passing flatus.   She was deemed stable for discharge to home.    Discharge Physical Exam:   BP 113/82 (BP Location: Left Arm)    Pulse 86    Temp 98.9 F (37.2 C) (Oral)    Resp 18    Ht 5\' 4"  (1.626 m)    Wt 62 kg    LMP 08/23/2020 (Exact Date) Comment: FHT in pre-op: 158   SpO2 95%    Breastfeeding Unknown    BMI 23.46 kg/m   General: alert and no distress Pulm: normal respiratory effort Lochia: appropriate Abdomen: soft, NT Uterine Fundus: firm, below umbilicus Incision: c/d/i, healing well, no significant drainage, no dehiscence, no significant erythema Extremities: No evidence of DVT seen on physical exam. No lower extremity edema. Edinburgh: No flowsheet data found.   Labs: CBC Latest Ref Rng & Units 06/10/2021 06/09/2021 06/08/2021  WBC 4.0 - 10.5 K/uL 10.3 13.6(H) 15.4(H)  Hemoglobin 12.0 - 15.0 g/dL 8.6(L) 10.0(L) 11.6(L)  Hematocrit 36.0 - 46.0 % 25.6(L) 29.4(L) 35.4(L)  Platelets 150 - 400 K/uL 169 168 172   A POS Hemoglobin  Date Value Ref Range Status  06/10/2021 8.6 (L) 12.0 - 15.0 g/dL Final   HCT  Date Value Ref Range Status  06/10/2021 25.6 (L) 36.0 - 46.0 % Final    Disposition: stable, discharge to home Baby Feeding: breastmilk Baby Disposition: home with mom  Contraception: TBD  Prenatal Labs:  Blood type/Rh A Pos  Antibody screen neg  Rubella Immune  Varicella Immune  RPR NR  HBsAg Neg  HIV NR  GC neg  Chlamydia neg  Genetic screening  declined  1 hour GTT  90  3 hour GTT    GBS  neg   Rh Immune globulin given: n/a Rubella vaccine given: Immune Varicella vaccine given: Immune Tdap vaccine given in AP or PP setting: Offered PP Flu vaccine given in  AP or PP setting: 04/14/21  Plan: Carolyn Munoz was discharged to home in good condition.  Discharge Instructions: Per After Visit Summary. Activity: Advance as tolerated. Pelvic rest for 6 weeks.   Diet: Regular Discharge Medications: Allergies as of 06/10/2021       Reactions   Banana    Abdominal Pain        Medication List     STOP taking these medications    doxylamine  (Sleep) 25 MG tablet Commonly known as: UNISOM   gabapentin 300 MG capsule Commonly known as: NEURONTIN   ondansetron 4 MG disintegrating tablet Commonly known as: ZOFRAN-ODT       TAKE these medications    acetaminophen 500 MG tablet Commonly known as: TYLENOL Take 2 tablets (1,000 mg total) by mouth every 6 (six) hours. What changed: when to take this   ferrous sulfate 325 (65 FE) MG tablet Take 1 tablet (325 mg total) by mouth 2 (two) times daily with a meal.   menthol-cetylpyridinium 3 MG lozenge Commonly known as: CEPACOL Take 1 lozenge (3 mg total) by mouth every 2 (two) hours as needed for sore throat.   oxyCODONE 5 MG immediate release tablet Commonly known as: Oxy IR/ROXICODONE Take 1 tablet (5 mg total) by mouth every 6 (six) hours as needed for severe pain.   PRENATAL GUMMIES PO Take 2 capsules by mouth daily.   simethicone 80 MG chewable tablet Commonly known as: MYLICON Chew 1 tablet (80 mg total) by mouth 3 (three) times daily.       Outpatient follow up:   Follow-up Information     Benjaman Kindler, MD Follow up in 2 week(s).   Specialty: Obstetrics and Gynecology Why: For postop check Contact information: [place] [address] [phone]         [facility]. Schedule an appointment as soon as possible for a visit on 06/14/2021.   Why: for a blood pressure check Contact information: [place] [phone]        Urology. Schedule an appointment as soon as possible for a visit.   Why: for kidney stone follow up                Signed: Linda Hedges, CNM 06/10/2021 10:41 AM

## 2021-06-10 NOTE — Discharge Instructions (Signed)
Discharge instructions:   Keep dressing in place for 5 days. If it falls off that is ok, keep area clean and dry. May remove if dressing becomes completely saturated.  Call office if you have any of the following:  headache, visual changes, fever >101.0 F, chills, breast concerns (engorgement, mastitis) excessive vaginal bleeding, incision drainage or problems, leg pain or redness, depression or any other concerns.   Activity: Do not lift > 10 lbs for 6 weeks.  No intercourse or tampons for 6 weeks.  No driving for 1-2 weeks or while taking pain medication. No strenuous activity or heavy lifting for 6 weeks.  No swimming pools, hot tubs or tub baths- showers only.    It is normal to bleed for up to 6 weeks. You should not soak through more than 1 pad in 1 hour.   Continue prenatal vitamin. Increase calories and fluids while breastfeeding.  Your milk will come in, in the next couple of days (right now it is colostrum).  You may have a slight fever when your milk comes in, but it should go away on its own.   If it does not, and rises above 101 F please call the doctor.  You will also feel achy and your breasts will be firm. They will also start to leak.  If you are breastfeeding, continue as you have been and you can pump/express milk for comfort.   For concerns about your baby, please call your pediatrician For breastfeeding concerns, the lactation consultant can be reached at [phone]  Postpartum blues (feelings of happy one minute and sad another minute) are normal for the first few weeks but if it gets worse let your doctor know.

## 2021-06-10 NOTE — Lactation Note (Signed)
This note was copied from a baby's chart. Lactation Consultation Note  Patient Name: Carolyn Munoz HKFEX'M Date: 06/10/2021 Reason for consult: Follow-up assessment;Mother's request;Primapara;Term Age:32 hours  Lactation called to assist with attempted feeding. Parents report baby hasn't eaten since last feeding about 5 hours ago, and baby had emesis while receiving a bath, has been sleepy ever since. LC removed blankets, and baby did wake. Placed support pillows for football hold on R breast, LC brought baby in; he grasped the breast easily had a rhythmic suck for a short period, relaxed and fell asleep. LC provided tips on keeping baby awake.  Parents and support person in the room asked about providing a bottle and mom pumping- this was ok'd as an option, but LC encouraged baby to be at breast as long as possible first for the hormonal stimulation and possible milk removal of colostrum to help with building milk supply; support voiced understanding. Reviewed paced-bottle feeding technique, keeping baby upright post feedings when receiving any nutrition via bottle to help in minimizing emesis.  Maternal Data Has patient been taught Hand Expression?: Yes Does the patient have breastfeeding experience prior to this delivery?: No  Feeding Mother's Current Feeding Choice: Breast Milk  LATCH Score Latch: Grasps breast easily, tongue down, lips flanged, rhythmical sucking.  Audible Swallowing: None (sleepy)  Type of Nipple: Flat  Comfort (Breast/Nipple): Soft / non-tender  Hold (Positioning): Assistance needed to correctly position infant at breast and maintain latch.  LATCH Score: 6   Lactation Tools Discussed/Used Tools: Nipple Shields Nipple shield size: 20 Breast pump type: Double-Electric Breast Pump Reason for Pumping: stimulation  Interventions Interventions: Assisted with latch;Hand express;Breast massage;Breast compression;Support pillows;Adjust position;Education;Pace  feeding  Discharge Discharge Education: Engorgement and breast care;Warning signs for feeding baby;Outpatient recommendation Pump: Personal  Consult Status Consult Status: PRN Date: 06/10/21 Follow-up type: Call as needed    Lavonia Drafts 06/10/2021, 1:29 PM

## 2021-06-12 ENCOUNTER — Telehealth: Payer: Self-pay | Admitting: Obstetrics and Gynecology

## 2021-06-12 ENCOUNTER — Other Ambulatory Visit: Payer: Self-pay

## 2021-06-12 ENCOUNTER — Encounter: Payer: Self-pay | Admitting: Pharmacy Technician

## 2021-06-12 ENCOUNTER — Emergency Department: Payer: BC Managed Care – PPO

## 2021-06-12 ENCOUNTER — Inpatient Hospital Stay
Admission: EM | Admit: 2021-06-12 | Discharge: 2021-06-15 | Disposition: A | Discharge status: Home / Self Care | Payer: BC Managed Care – PPO | Source: Home / Self Care | Attending: Obstetrics and Gynecology | Admitting: Obstetrics and Gynecology

## 2021-06-12 DIAGNOSIS — O1415 Severe pre-eclampsia, complicating the puerperium: Secondary | ICD-10-CM | POA: Diagnosis present

## 2021-06-12 DIAGNOSIS — O1495 Unspecified pre-eclampsia, complicating the puerperium: Secondary | ICD-10-CM | POA: Diagnosis present

## 2021-06-12 LAB — COMPREHENSIVE METABOLIC PANEL
ALT: 31 U/L (ref 0–44)
AST: 42 U/L — ABNORMAL HIGH (ref 15–41)
Albumin: 2.7 g/dL — ABNORMAL LOW (ref 3.5–5.0)
Alkaline Phosphatase: 95 U/L (ref 38–126)
Anion gap: 7 (ref 5–15)
BUN: 10 mg/dL (ref 6–20)
CO2: 24 mmol/L (ref 22–32)
Calcium: 8.7 mg/dL — ABNORMAL LOW (ref 8.9–10.3)
Chloride: 109 mmol/L (ref 98–111)
Creatinine, Ser: 0.55 mg/dL (ref 0.44–1.00)
GFR, Estimated: >60 mL/min (ref >60)
Glucose, Bld: 110 mg/dL — ABNORMAL HIGH (ref 70–99)
Potassium: 3.7 mmol/L (ref 3.5–5.1)
Sodium: 140 mmol/L (ref 135–145)
Total Bilirubin: 0.5 mg/dL (ref 0.3–1.2)
Total Protein: 5.5 g/dL — ABNORMAL LOW (ref 6.5–8.1)

## 2021-06-12 LAB — CBC WITH DIFFERENTIAL/PLATELET
Abs Immature Granulocytes: 0.09 10*3/uL — ABNORMAL HIGH (ref 0.00–0.07)
Basophils Absolute: 0 10*3/uL (ref 0.0–0.1)
Basophils Relative: 0 %
Eosinophils Absolute: 0.3 10*3/uL (ref 0.0–0.5)
Eosinophils Relative: 4 %
HCT: 28.8 % — ABNORMAL LOW (ref 36.0–46.0)
Hemoglobin: 9.7 g/dL — ABNORMAL LOW (ref 12.0–15.0)
Immature Granulocytes: 1 %
Lymphocytes Relative: 20 %
Lymphs Abs: 1.3 10*3/uL (ref 0.7–4.0)
MCH: 33.3 pg (ref 26.0–34.0)
MCHC: 33.7 g/dL (ref 30.0–36.0)
MCV: 99 fL (ref 80.0–100.0)
Monocytes Absolute: 0.4 10*3/uL (ref 0.1–1.0)
Monocytes Relative: 6 %
Neutro Abs: 4.7 10*3/uL (ref 1.7–7.7)
Neutrophils Relative %: 69 %
Platelets: 215 10*3/uL (ref 150–400)
RBC: 2.91 MIL/uL — ABNORMAL LOW (ref 3.87–5.11)
RDW: 13.8 % (ref 11.5–15.5)
WBC: 6.8 10*3/uL (ref 4.0–10.5)
nRBC: 0 % (ref 0.0–0.2)

## 2021-06-12 LAB — BASIC METABOLIC PANEL
Anion gap: 9 (ref 5–15)
BUN: 13 mg/dL (ref 6–20)
CO2: 21 mmol/L — ABNORMAL LOW (ref 22–32)
Calcium: 8.7 mg/dL — ABNORMAL LOW (ref 8.9–10.3)
Chloride: 109 mmol/L (ref 98–111)
Creatinine, Ser: 0.42 mg/dL — ABNORMAL LOW (ref 0.44–1.00)
GFR, Estimated: >60 mL/min (ref >60)
Glucose, Bld: 103 mg/dL — ABNORMAL HIGH (ref 70–99)
Potassium: 3.8 mmol/L (ref 3.5–5.1)
Sodium: 139 mmol/L (ref 135–145)

## 2021-06-12 LAB — HCG, QUANTITATIVE, PREGNANCY: hCG, Beta Chain, Quant, S: 867 m[IU]/mL — ABNORMAL HIGH (ref <5)

## 2021-06-12 IMAGING — CT CT CERVICAL SPINE W/O CM
3 of 4 series · 13 of 33 positions shown, 16 images · non-contrast
Comparison: None.

CLINICAL DATA: Headache. History of migraines.

EXAM:
CT HEAD WITHOUT CONTRAST
CT CERVICAL SPINE WITHOUT CONTRAST
TECHNIQUE: Multidetector CT imaging of the head and cervical spine was
performed following the standard protocol without intravenous
contrast. Multiplanar CT image reconstructions of the cervical spine
were also generated.

[Series 6: orthogonal bone · axial · 0.28mm/px · z∈[-299,-193]mm · 5 of 92 slices shown, 7 images]
[im 16/92  soft-tissue]
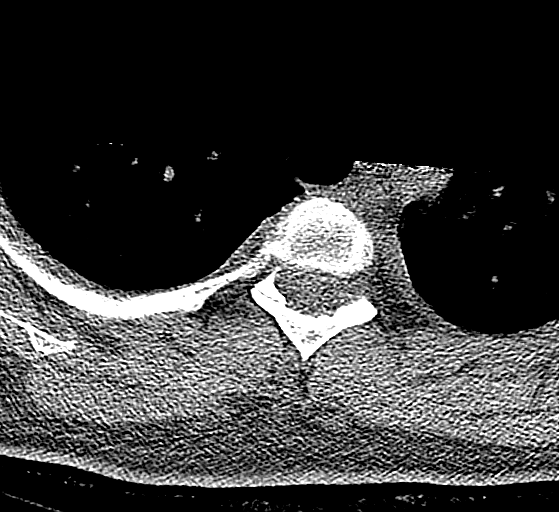
[im 16/92  bone]
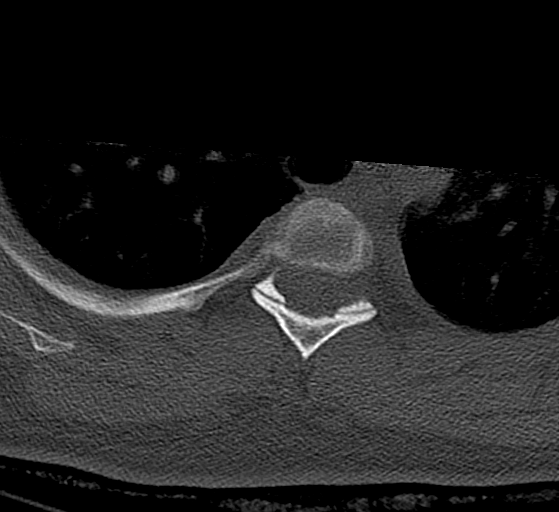
[im 31/92  bone]
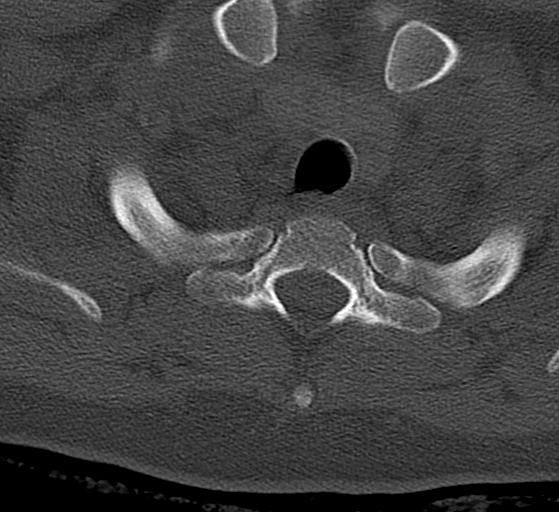
[im 46/92  bone]
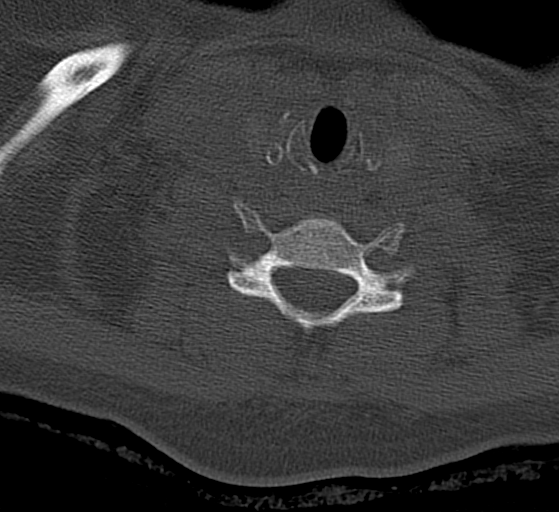
[im 61/92  bone]
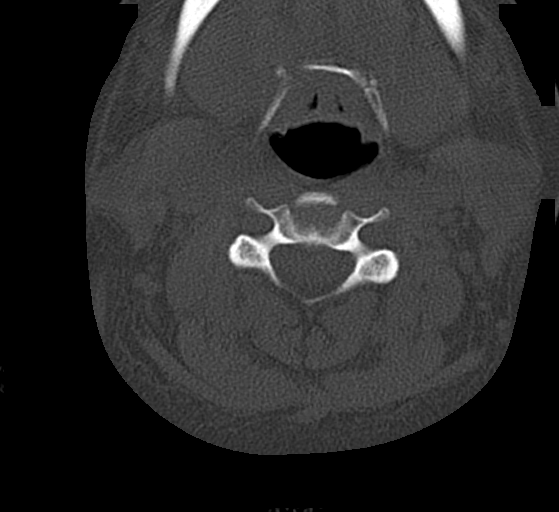
[im 76/92  soft-tissue]
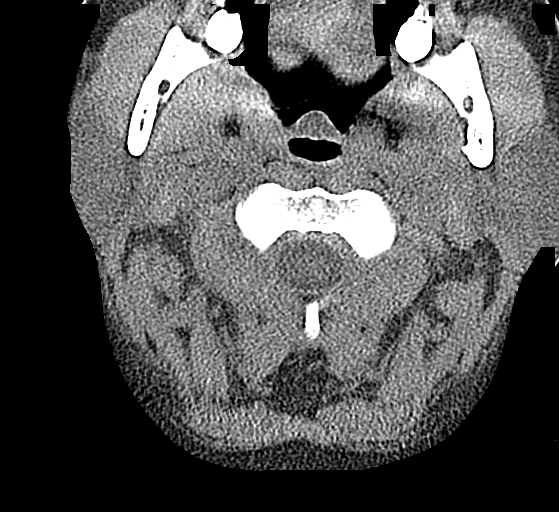
[im 76/92  bone]
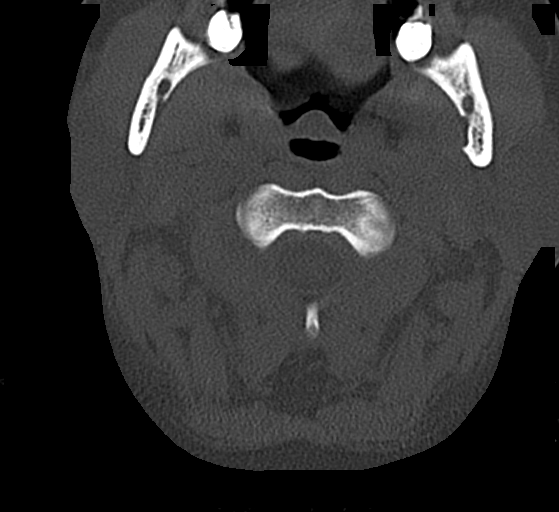

[Series 7: sagittal bone · sagittal · 0.28mm/px · 5 of 75 slices shown, 6 images]
[im 25/75  bone]
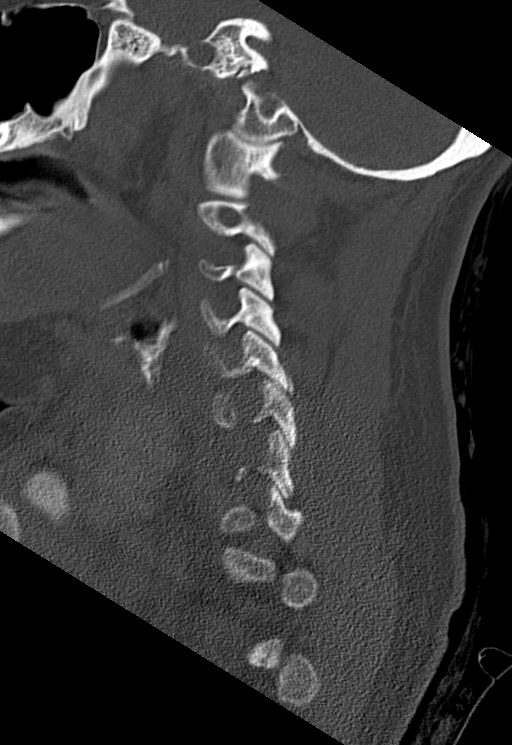
[im 31/75  bone]
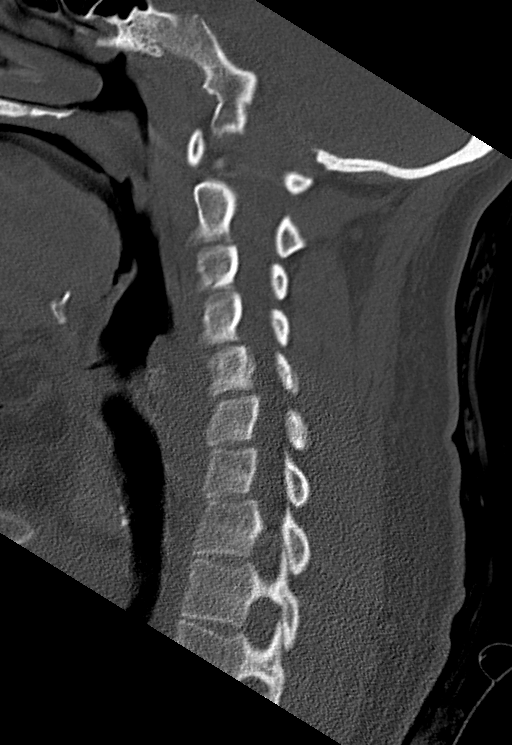
[im 38/75  soft-tissue]
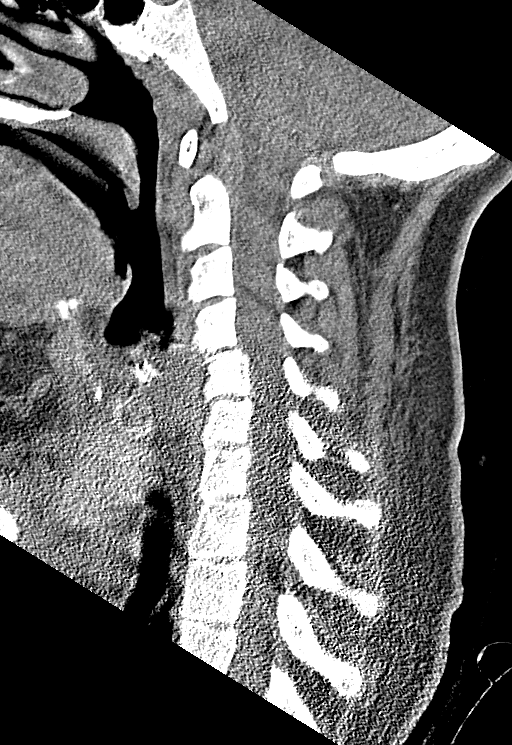
[im 38/75  bone]
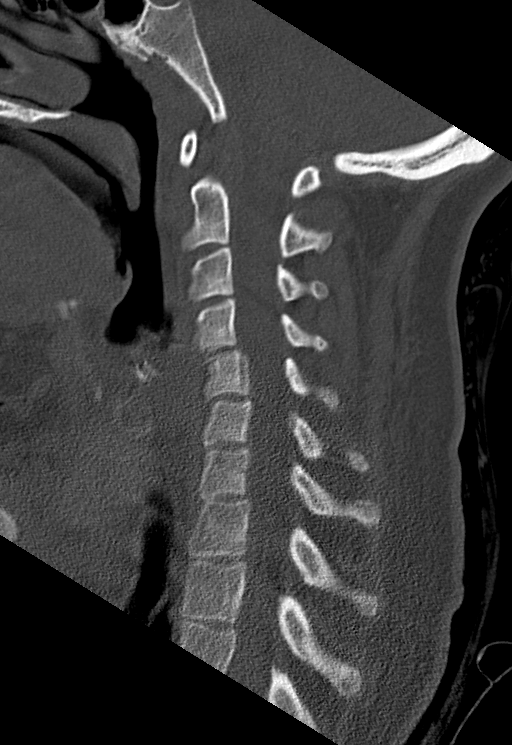
[im 44/75  bone]
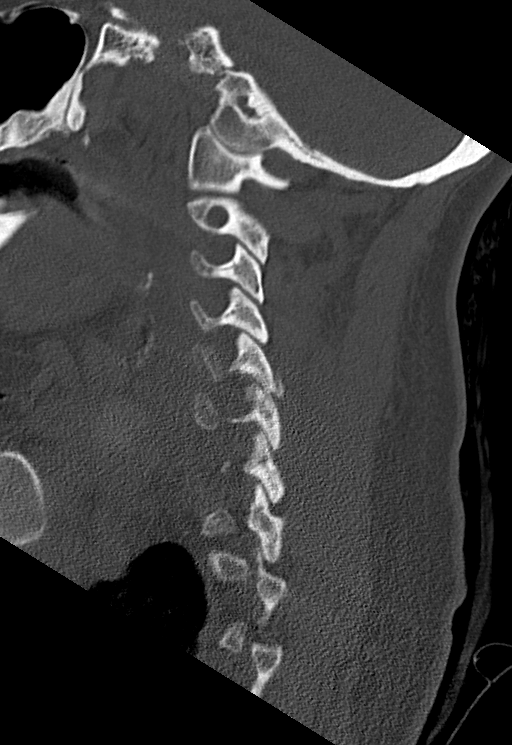
[im 50/75  bone]
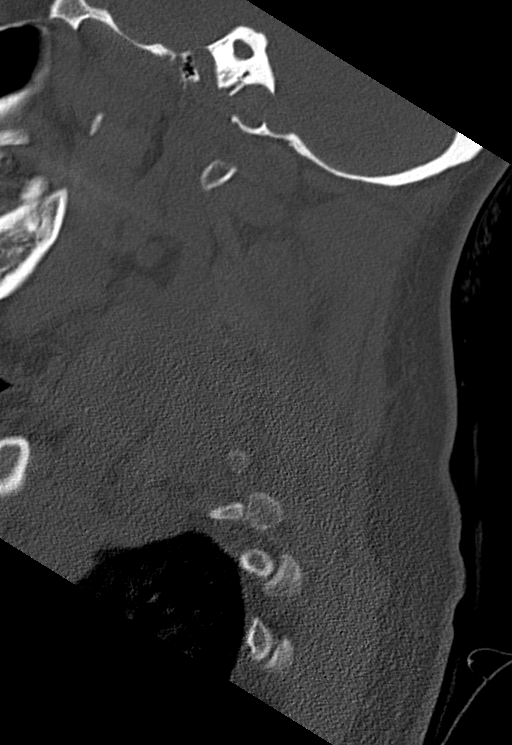

[Series 8: coronal bone · coronal · 0.30mm/px · 3 of 72 slices shown]
[im 18/72  bone]
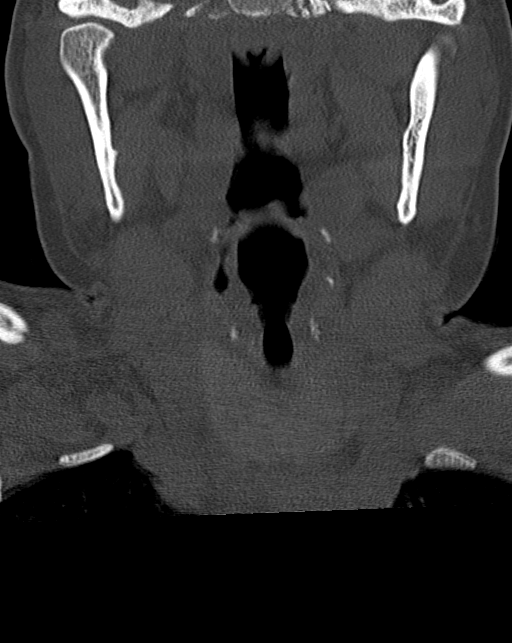
[im 30/72  bone]
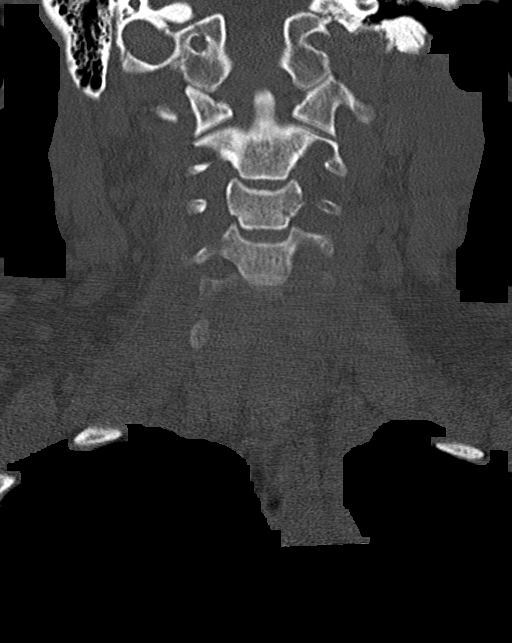
[im 42/72  bone]
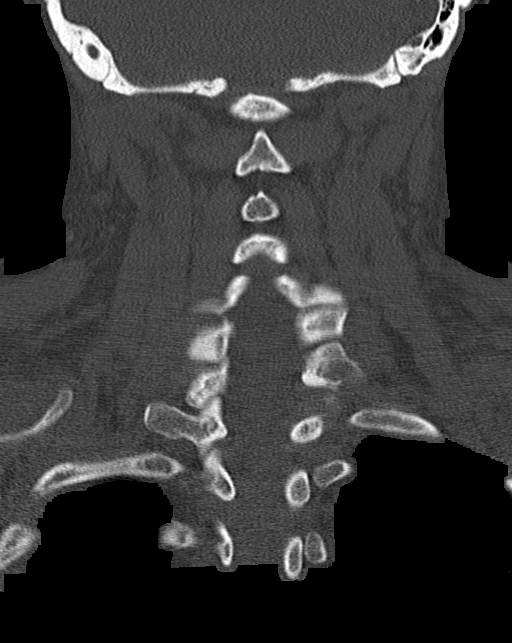

[13 of 33 positions shown; findings below may reference images not displayed]

FINDINGS: CT HEAD FINDINGS

Brain: No evidence of acute infarction, hemorrhage, hydrocephalus,
extra-axial collection or mass lesion/mass effect.

Vascular: No hyperdense vessel or unexpected calcification.

Skull: Normal. Negative for fracture or focal lesion.

Sinuses/Orbits: No acute finding.

Other: None.

CT CERVICAL SPINE FINDINGS

Alignment: Normal. Apparent anterolisthesis at C4-C5 is due to
motion artifact.

Skull base and vertebrae: No acute fracture. No primary bone lesion
or focal pathologic process.

Soft tissues and spinal canal: No prevertebral fluid or swelling. No
visible canal hematoma.

Disc levels:  Normal.

Upper chest: Negative.

Other: None.
IMPRESSION: 1. Normal noncontrast head and cervical spine CTs.
2. Apparent anterolisthesis at C4-C5 is due to motion artifact.

## 2021-06-12 IMAGING — CT CT HEAD W/O CM
4 series · 17 of 47 positions shown, 19 images · non-contrast
Comparison: None.

CLINICAL DATA: Headache. History of migraines.

EXAM:
CT HEAD WITHOUT CONTRAST
CT CERVICAL SPINE WITHOUT CONTRAST
TECHNIQUE: Multidetector CT imaging of the head and cervical spine was
performed following the standard protocol without intravenous
contrast. Multiplanar CT image reconstructions of the cervical spine
were also generated.

[Series 2: head wo · axial · 0.42mm/px · z∈[-143,-23]mm · 7 of 34 slices shown, 9 images]
[im 5/34  brain]
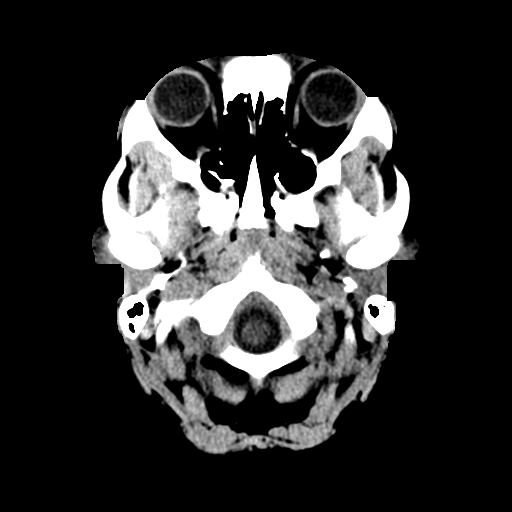
[im 5/34  bone]
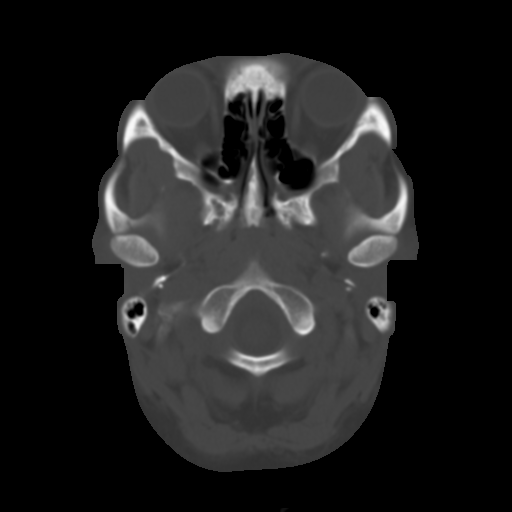
[im 9/34  brain]
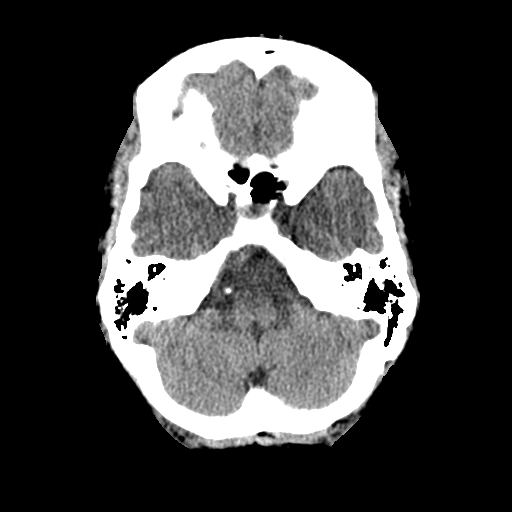
[im 13/34  brain]
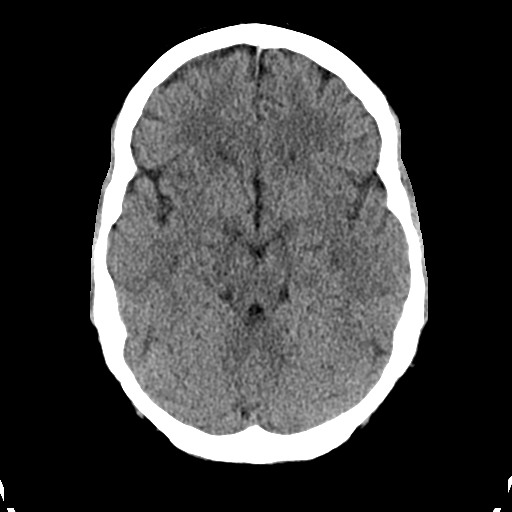
[im 17/34  brain]
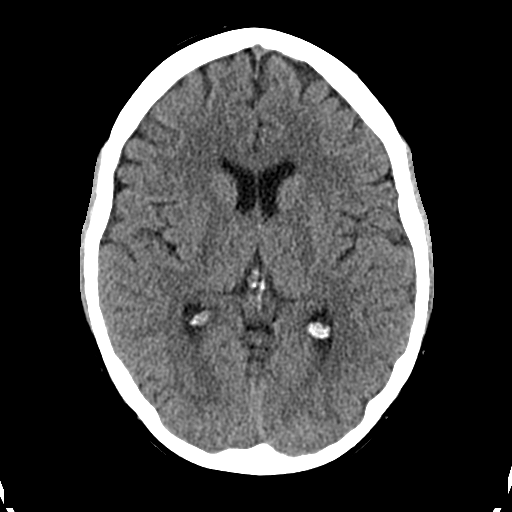
[im 21/34  brain]
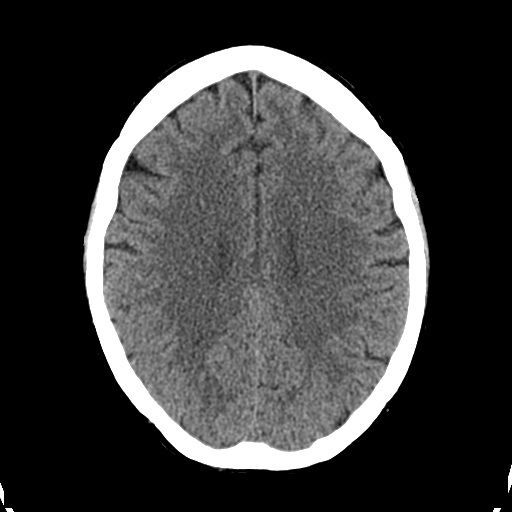
[im 21/34  bone]
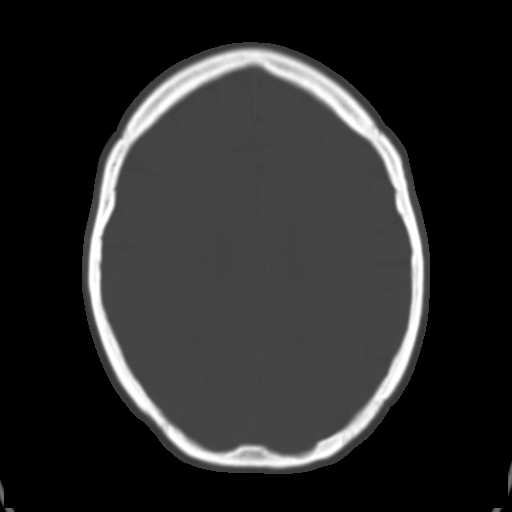
[im 25/34  brain]
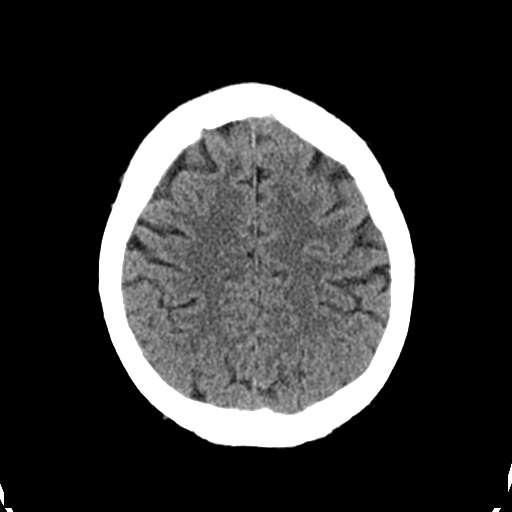
[im 29/34  brain]
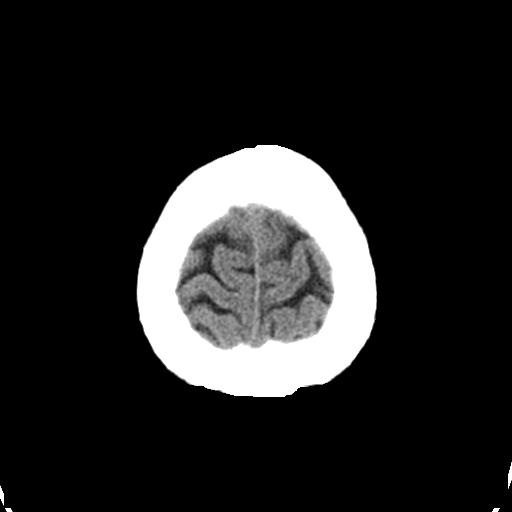

[Series 3: head bone · axial · 0.42mm/px · z∈[-147,-89]mm · 4 of 84 slices shown]
[im 9/84  bone]
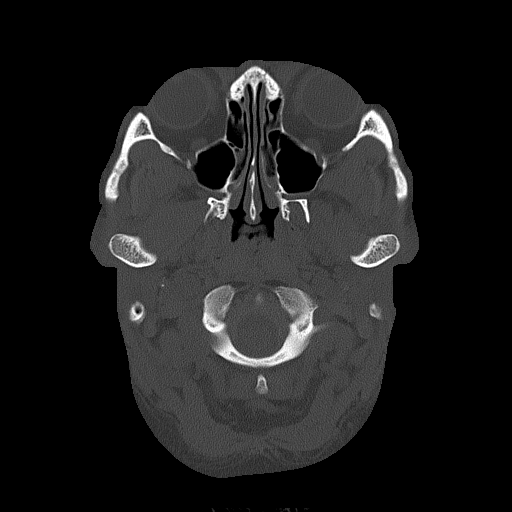
[im 17/84  bone]
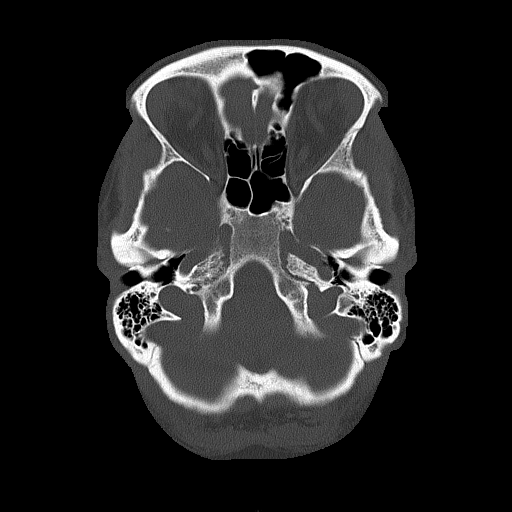
[im 25/84  bone]
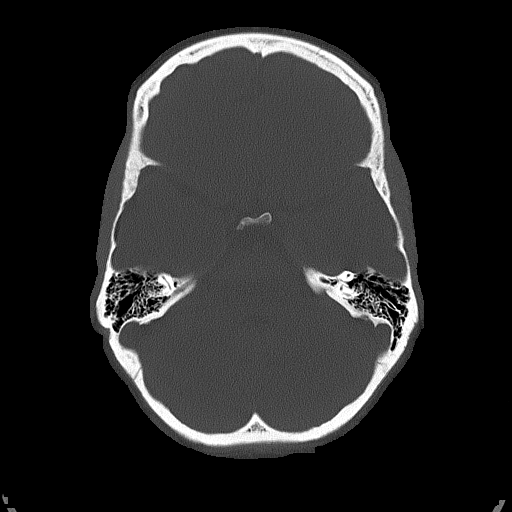
[im 38/84  bone]
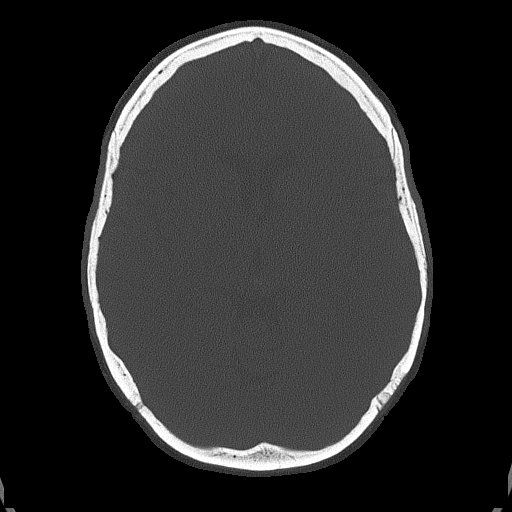

[Series 4: coronal soft tissue · coronal · 0.34mm/px · 3 of 71 slices shown]
[im 24/71  brain]
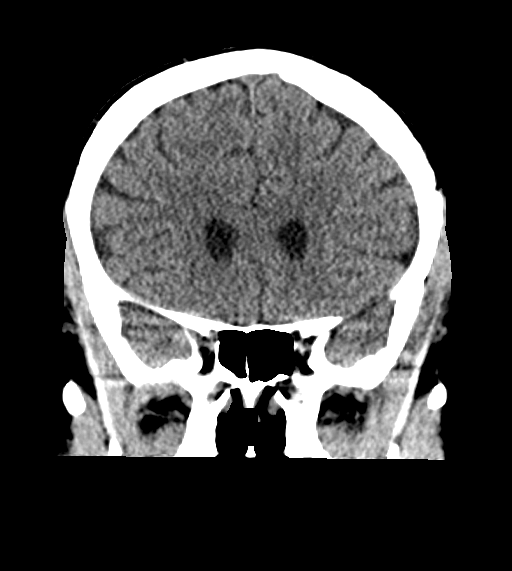
[im 32/71  brain]
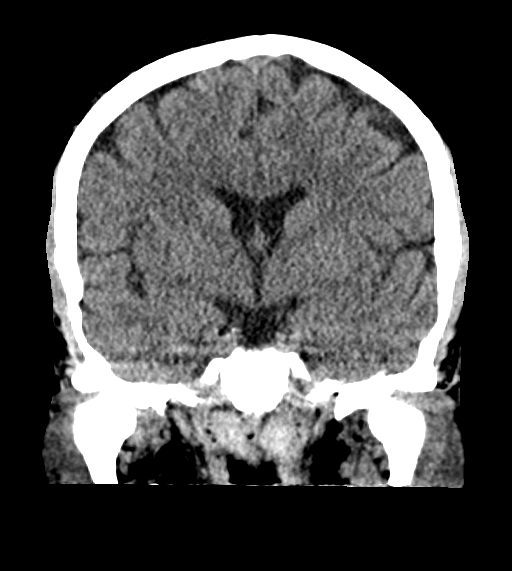
[im 39/71  brain]
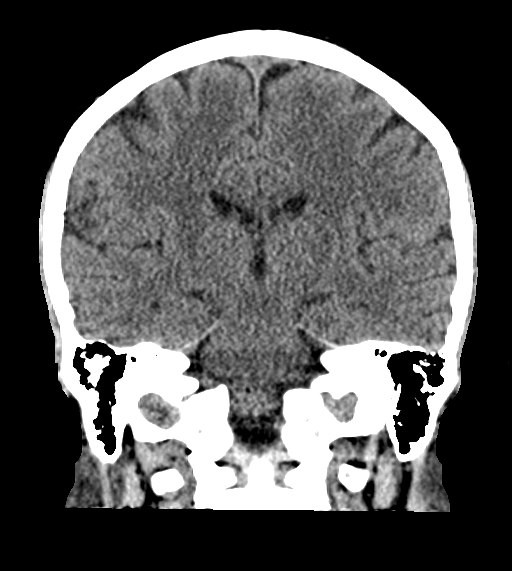

[Series 5: sagittal soft tissue · sagittal · 0.39mm/px · 3 of 58 slices shown]
[im 20/58  brain]
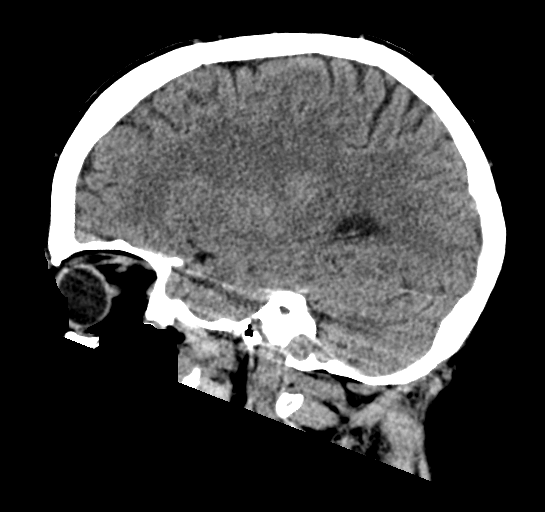
[im 29/58  brain]
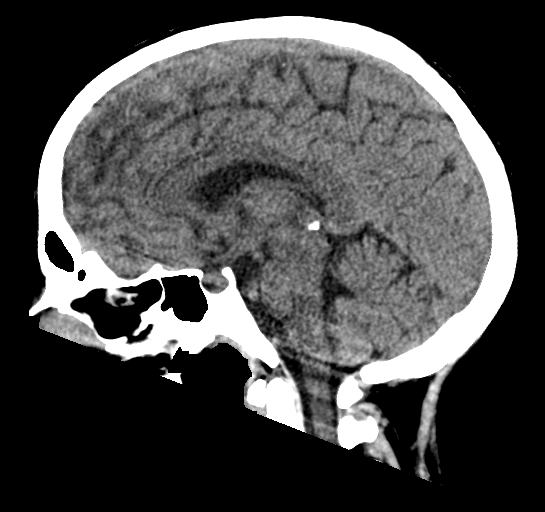
[im 39/58  brain]
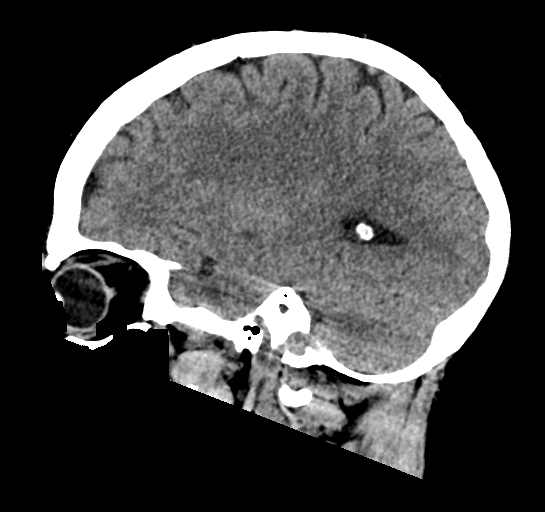

[17 of 47 positions shown; findings below may reference images not displayed]

FINDINGS: CT HEAD FINDINGS

Brain: No evidence of acute infarction, hemorrhage, hydrocephalus,
extra-axial collection or mass lesion/mass effect.

Vascular: No hyperdense vessel or unexpected calcification.

Skull: Normal. Negative for fracture or focal lesion.

Sinuses/Orbits: No acute finding.

Other: None.

CT CERVICAL SPINE FINDINGS

Alignment: Normal. Apparent anterolisthesis at C4-C5 is due to
motion artifact.

Skull base and vertebrae: No acute fracture. No primary bone lesion
or focal pathologic process.

Soft tissues and spinal canal: No prevertebral fluid or swelling. No
visible canal hematoma.

Disc levels:  Normal.

Upper chest: Negative.

Other: None.
IMPRESSION: 1. Normal noncontrast head and cervical spine CTs.
2. Apparent anterolisthesis at C4-C5 is due to motion artifact.

## 2021-06-12 MED ORDER — DIBUCAINE (PERIANAL) 1 % EX OINT: 1.0000 "application " | TOPICAL_OINTMENT | CUTANEOUS | Status: DC | PRN

## 2021-06-12 MED ORDER — WITCH HAZEL-GLYCERIN EX PADS: 1.0000 "application " | MEDICATED_PAD | CUTANEOUS | Status: DC | PRN

## 2021-06-12 MED ORDER — MAGNESIUM SULFATE 40 GM/1000ML IV SOLN
2.0000 g/h | INTRAVENOUS | Status: DC
  Administered 2021-06-12 – 2021-06-13 (×2): 2 g/h via INTRAVENOUS
  Filled 2021-06-12 (×2): qty 1000

## 2021-06-12 MED ORDER — ZOLPIDEM TARTRATE 5 MG PO TABS
5.0000 mg | ORAL_TABLET | Freq: Every evening | ORAL | Status: DC | PRN
  Administered 2021-06-12 – 2021-06-13 (×2): 5 mg via ORAL
  Filled 2021-06-12 (×2): qty 1

## 2021-06-12 MED ORDER — HYDRALAZINE HCL 20 MG/ML IJ SOLN: 10.0000 mg | INTRAMUSCULAR | Status: DC | PRN

## 2021-06-12 MED ORDER — ACETAMINOPHEN 500 MG PO TABS
1000.0000 mg | ORAL_TABLET | Freq: Once | ORAL | Status: AC
  Administered 2021-06-12: 1000 mg via ORAL
  Filled 2021-06-12: qty 2

## 2021-06-12 MED ORDER — SIMETHICONE 80 MG PO CHEW
80.0000 mg | CHEWABLE_TABLET | ORAL | Status: DC | PRN
  Administered 2021-06-12 – 2021-06-13 (×2): 80 mg via ORAL
  Filled 2021-06-12 (×2): qty 1

## 2021-06-12 MED ORDER — IBUPROFEN 600 MG PO TABS
600.0000 mg | ORAL_TABLET | Freq: Four times a day (QID) | ORAL | Status: DC
  Administered 2021-06-12 – 2021-06-15 (×9): 600 mg via ORAL
  Filled 2021-06-12 (×10): qty 1

## 2021-06-12 MED ORDER — SENNOSIDES-DOCUSATE SODIUM 8.6-50 MG PO TABS
2.0000 | ORAL_TABLET | Freq: Every day | ORAL | Status: DC
  Administered 2021-06-13 – 2021-06-15 (×3): 2 via ORAL
  Filled 2021-06-12 (×3): qty 2

## 2021-06-12 MED ORDER — MAGNESIUM SULFATE BOLUS VIA INFUSION
4.0000 g | Freq: Once | INTRAVENOUS | Status: AC
  Administered 2021-06-12: 4 g via INTRAVENOUS
  Filled 2021-06-12: qty 1000

## 2021-06-12 MED ORDER — LABETALOL HCL 5 MG/ML IV SOLN: 40.0000 mg | INTRAVENOUS | Status: DC | PRN

## 2021-06-12 MED ORDER — DIPHENHYDRAMINE HCL 25 MG PO CAPS: 25.0000 mg | ORAL_CAPSULE | Freq: Four times a day (QID) | ORAL | Status: DC | PRN

## 2021-06-12 MED ORDER — LABETALOL HCL 5 MG/ML IV SOLN: 20.0000 mg | INTRAVENOUS | Status: DC | PRN

## 2021-06-12 MED ORDER — ONDANSETRON HCL 4 MG PO TABS: 4.0000 mg | ORAL_TABLET | ORAL | Status: DC | PRN

## 2021-06-12 MED ORDER — LACTATED RINGERS IV SOLN: INTRAVENOUS | Status: DC

## 2021-06-12 MED ORDER — PRENATAL MULTIVITAMIN CH
1.0000 | ORAL_TABLET | Freq: Every day | ORAL | Status: DC
  Administered 2021-06-13 – 2021-06-14 (×2): 1 via ORAL
  Filled 2021-06-12 (×2): qty 1

## 2021-06-12 MED ORDER — OXYCODONE HCL 5 MG PO TABS
10.0000 mg | ORAL_TABLET | ORAL | Status: DC | PRN
  Administered 2021-06-12: 10 mg via ORAL
  Filled 2021-06-12: qty 2

## 2021-06-12 MED ORDER — METOCLOPRAMIDE HCL 5 MG/ML IJ SOLN
10.0000 mg | Freq: Once | INTRAMUSCULAR | Status: AC
  Administered 2021-06-12: 10 mg via INTRAVENOUS
  Filled 2021-06-12: qty 2

## 2021-06-12 MED ORDER — ONDANSETRON HCL 4 MG/2ML IJ SOLN: 4.0000 mg | INTRAMUSCULAR | Status: DC | PRN

## 2021-06-12 MED ORDER — ACETAMINOPHEN 325 MG PO TABS
650.0000 mg | ORAL_TABLET | ORAL | Status: DC | PRN
  Administered 2021-06-13 – 2021-06-15 (×9): 650 mg via ORAL
  Filled 2021-06-12 (×9): qty 2

## 2021-06-12 MED ORDER — COCONUT OIL OIL
1.0000 "application " | TOPICAL_OIL | Status: DC | PRN
  Filled 2021-06-12: qty 120

## 2021-06-12 MED ORDER — OXYCODONE HCL 5 MG PO TABS
5.0000 mg | ORAL_TABLET | ORAL | Status: DC | PRN
  Administered 2021-06-13 – 2021-06-15 (×2): 5 mg via ORAL
  Filled 2021-06-12 (×2): qty 1

## 2021-06-12 MED ORDER — PROCHLORPERAZINE EDISYLATE 10 MG/2ML IJ SOLN
10.0000 mg | Freq: Once | INTRAMUSCULAR | Status: AC
  Administered 2021-06-12: 10 mg via INTRAVENOUS
  Filled 2021-06-12: qty 2

## 2021-06-12 MED ORDER — LABETALOL HCL 5 MG/ML IV SOLN: 80.0000 mg | INTRAVENOUS | Status: DC | PRN

## 2021-06-12 MED ORDER — FERROUS SULFATE 325 (65 FE) MG PO TABS
325.0000 mg | ORAL_TABLET | Freq: Two times a day (BID) | ORAL | Status: DC
  Administered 2021-06-13 – 2021-06-15 (×5): 325 mg via ORAL
  Filled 2021-06-12 (×5): qty 1

## 2021-06-12 NOTE — ED Notes (Signed)
Pt with significant edema noted to bilateral lower extremities. Pt states she had a baby on Tuesday and headache continues. Spoke with labor and delivery who states they would like to see pt in their department due to possibility of preeclampsi, spoke with dana. Discussed with dr. Joni Fears who agrees to send pt to labor and delivery. Pt and husband updated on plan.

## 2021-06-12 NOTE — ED Provider Triage Note (Signed)
°  Emergency Medicine Provider Triage Evaluation Note  Carolyn Munoz , a 31 y.o.female,  was evaluated in triage.  Pt complains of headache.  Patient states that she has a history of migraines, but this feels very different.  Patient states that this started this morning and has progressively worsened over the past few hours.  Endorses nausea and floaters in her vision.  Denies chest pain, shortness of breath, abdominal pain, vomiting, or urinary symptoms.   Review of Systems  Positive: Headache, nausea Negative: Denies fever, chest pain, vomiting  Physical Exam   Vitals:   06/12/21 1745  BP: (!) 149/92  Pulse: 65  Resp: (!) 22  Temp: 98.1 F (36.7 C)  SpO2: 97%   Gen:   Awake, distressed Resp:  Normal effort  MSK:   Moves extremities without difficulty  Other:    Medical Decision Making  Given the patient's initial medical screening exam, the following diagnostic evaluation has been ordered. The patient will be placed in the appropriate treatment space, once one is available, to complete the evaluation and treatment. I have discussed the plan of care with the patient and I have advised the patient that an ED physician or mid-level practitioner will reevaluate their condition after the test results have been received, as the results may give them additional insight into the type of treatment they may need.    Diagnostics: Labs, head/neck CT  Treatments: Compazine, metoclopramide, acetaminophen   Teodoro Spray, [place] 06/12/21 1816

## 2021-06-12 NOTE — Telephone Encounter (Signed)
Pts spouse called due to pt having severe HA all day today. Reports having a piercing migraine like HA, no prior migraine hx. Pt has taken ibuprofen and an excedrin without relief. Pt's spouse reports she is crying and unable to "do anything".   Pt is POD4 after primary LTCS on Tuesday 06/08/21; No hx HTN during pregnancy or immediately PP.   Instructed to come to [facility] ER for eval.   Francetta Found, CNM 06/12/2021 5:46 PM

## 2021-06-12 NOTE — ED Triage Notes (Signed)
Pt here with reports of sudden headache onset around lunchtime today. Pt tearful in triage. Took motrin and Excedrin without relief.

## 2021-06-12 NOTE — H&P (Signed)
History of Present Illness:   Chief Complaint: severe headache since this morning.   HPI:  Carolyn Munoz is a 31 y.o. G71P1001 female at 5 days postpartum who presented to the ED with complaints of severe HA.  She had an uncomplicated primary LTCS for active phase arrest on 06/08/2021.  Carolyn Munoz was discharged home in stable condition on 06/10/2021 and did not require oral antihypertensives prior to discharge.  She reports the following symptoms of HA that started this AM.  Lower extremity edema has been ongoing and she also had low urine output during her initial hospitalization.  - Pt was evaluated and treated for her HA in the ER, but not evaluated for preeclampsia.  - She was noted to have mild range blood pressures that did not require treatment, preeclampsia labs are currently pending.   - Decision made to admit for postpartum preeclampsia with severe features and magnesium sulfate infusion.    Headache: present and returning, some relief with compazine and reglan given in ER this evening.  Changes in vision: light sensitivity noted.  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  Asthma- no meds History of anxiety/depression- no meds  Patient Active Problem List   Diagnosis Date Noted   S/P cesarean section 06/10/2021   Preeclampsia in postpartum period 06/10/2021   Ovarian cyst, bilateral 12/17/2020     Maternal Medical History:   Past Medical History:  Diagnosis Date   Anxiety    Asthma    age teens to age 17   Bicuspid aortic valve    Complex ovarian cyst    Depression     Past Surgical History:  Procedure Laterality Date   APPENDECTOMY  2007   CESAREAN SECTION  06/08/2021   Procedure: CESAREAN SECTION;  Surgeon: Benjaman Kindler, MD;  Location: [facility] ORS;  Service: Obstetrics;;   OVARIAN CYST REMOVAL Bilateral 12/17/2020   Procedure: Open OVARIAN  CYSTECTOMY with pelvic washings;  Surgeon: Benjaman Kindler, MD;  Location: [facility] ORS;  Service: Gynecology;  Laterality: Bilateral;    Allergies  Allergen Reactions   Banana     Abdominal Pain    Prior to Admission medications   Medication Sig Start Date End Date Taking? Authorizing Provider  acetaminophen (TYLENOL) 500 MG tablet Take 2 tablets (1,000 mg total) by mouth every 6 (six) hours. 06/10/21   Linda Hedges, CNM  ferrous sulfate 325 (65 FE) MG tablet Take 1 tablet (325 mg total) by mouth 2 (two) times daily with a meal. 06/10/21   Linda Hedges, CNM  menthol-cetylpyridinium (CEPACOL) 3 MG lozenge Take 1 lozenge (3 mg total) by mouth every 2 (two) hours as needed for sore throat. 12/18/20   Ameet Sandy, Murray Hodgkins, CNM  oxyCODONE (OXY IR/ROXICODONE) 5 MG immediate release tablet Take 1 tablet (5 mg total) by mouth every 6 (six) hours as needed for severe pain. 06/10/21   Linda Hedges, CNM  Prenatal MV & Min w/FA-DHA (PRENATAL GUMMIES PO) Take 2 capsules by mouth daily.    [provider]  simethicone (MYLICON) 80 MG chewable tablet Chew 1 tablet (80 mg total) by mouth 3 (three) times daily. 12/18/20   Aniqa Hare, Murray Hodgkins, CNM     Prenatal care site:  [facility]  Social History: She  reports that she has never smoked. She has never used smokeless tobacco. She reports that she does not currently use alcohol. She reports that she does not  use drugs.  Family History: family history includes Breast cancer in her mother; Heart disease in her mother.   Review of Systems: A full review of systems was performed and negative except as noted in the HPI.     Physical Exam:  Vital Signs: BP (!) 154/92    Pulse 66    Temp 98.1 F (36.7 C) (Oral)    Resp 20    SpO2 96%  Physical Exam  General: no acute distress.  HEENT: normocephalic, atraumatic Heart: regular rate & rhythm.  No murmurs/rubs/gallops Lungs: clear to auscultation bilaterally, normal respiratory effort Abdomen:  soft, gravid, non-tender; fundus firm, U/3; pfannienstiel incision healing well, covered with honeycomb dressing- no erythema or drainage.  Pelvic: deferred   Extremities: non-tender, symmetric, 3+ LE edema bilaterally.  DTRs: 3+, no clonus.   Neurologic: Alert & oriented x 3.    Results for orders placed or performed during the hospital encounter of 06/12/21 (from the past 24 hour(s))  Basic metabolic panel     Status: Abnormal   Collection Time: 06/12/21  6:09 PM  Result Value Ref Range   Sodium 139 135 - 145 mmol/L   Potassium 3.8 3.5 - 5.1 mmol/L   Chloride 109 98 - 111 mmol/L   CO2 21 (L) 22 - 32 mmol/L   Glucose, Bld 103 (H) 70 - 99 mg/dL   BUN 13 6 - 20 mg/dL   Creatinine, Ser 0.42 (L) 0.44 - 1.00 mg/dL   Calcium 8.7 (L) 8.9 - 10.3 mg/dL   GFR, Estimated >60 >60 mL/min   Anion gap 9 5 - 15  CBC with Differential     Status: Abnormal   Collection Time: 06/12/21  6:09 PM  Result Value Ref Range   WBC 6.8 4.0 - 10.5 K/uL   RBC 2.91 (L) 3.87 - 5.11 MIL/uL   Hemoglobin 9.7 (L) 12.0 - 15.0 g/dL   HCT 28.8 (L) 36.0 - 46.0 %   MCV 99.0 80.0 - 100.0 fL   MCH 33.3 26.0 - 34.0 pg   MCHC 33.7 30.0 - 36.0 g/dL   RDW 13.8 11.5 - 15.5 %   Platelets 215 150 - 400 K/uL   nRBC 0.0 0.0 - 0.2 %   Neutrophils Relative % 69 %   Neutro Abs 4.7 1.7 - 7.7 K/uL   Lymphocytes Relative 20 %   Lymphs Abs 1.3 0.7 - 4.0 K/uL   Monocytes Relative 6 %   Monocytes Absolute 0.4 0.1 - 1.0 K/uL   Eosinophils Relative 4 %   Eosinophils Absolute 0.3 0.0 - 0.5 K/uL   Basophils Relative 0 %   Basophils Absolute 0.0 0.0 - 0.1 K/uL   Immature Granulocytes 1 %   Abs Immature Granulocytes 0.09 (H) 0.00 - 0.07 K/uL  hCG, quantitative, pregnancy     Status: Abnormal   Collection Time: 06/12/21  6:09 PM  Result Value Ref Range   hCG, Beta Chain, Quant, S 867 (H) <5 mIU/mL    Pertinent Results:  Prenatal Labs: Blood type/Rh A pos  Antibody screen neg  Rubella Immune  Varicella Immune  RPR NR  HBsAg  Neg  HIV NR  GC neg  Chlamydia neg  Genetic screening  declined  1 hour GTT 90  3 hour GTT N/a  GBS neg    CT Head Wo Contrast  Result Date: 06/12/2021 CLINICAL DATA:  Headache. History of migraines. EXAM: CT HEAD WITHOUT CONTRAST CT CERVICAL SPINE WITHOUT CONTRAST TECHNIQUE: Multidetector CT imaging of the head and  cervical spine was performed following the standard protocol without intravenous contrast. Multiplanar CT image reconstructions of the cervical spine were also generated. COMPARISON:  None. FINDINGS: CT HEAD FINDINGS Brain: No evidence of acute infarction, hemorrhage, hydrocephalus, extra-axial collection or mass lesion/mass effect. Vascular: No hyperdense vessel or unexpected calcification. Skull: Normal. Negative for fracture or focal lesion. Sinuses/Orbits: No acute finding. Other: None. CT CERVICAL SPINE FINDINGS Alignment: Normal. Apparent anterolisthesis at C4-C5 is due to motion artifact. Skull base and vertebrae: No acute fracture. No primary bone lesion or focal pathologic process. Soft tissues and spinal canal: No prevertebral fluid or swelling. No visible canal hematoma. Disc levels:  Normal. Upper chest: Negative. Other: None. IMPRESSION: 1. Normal noncontrast head and cervical spine CTs. 2. Apparent anterolisthesis at C4-C5 is due to motion artifact. Electronically Signed   By: Titus Dubin M.D.   On: 06/12/2021 19:14   CT Cervical Spine Wo Contrast  Result Date: 06/12/2021 CLINICAL DATA:  Headache. History of migraines. EXAM: CT HEAD WITHOUT CONTRAST CT CERVICAL SPINE WITHOUT CONTRAST TECHNIQUE: Multidetector CT imaging of the head and cervical spine was performed following the standard protocol without intravenous contrast. Multiplanar CT image reconstructions of the cervical spine were also generated. COMPARISON:  None. FINDINGS: CT HEAD FINDINGS Brain: No evidence of acute infarction, hemorrhage, hydrocephalus, extra-axial collection or mass lesion/mass effect. Vascular:  No hyperdense vessel or unexpected calcification. Skull: Normal. Negative for fracture or focal lesion. Sinuses/Orbits: No acute finding. Other: None. CT CERVICAL SPINE FINDINGS Alignment: Normal. Apparent anterolisthesis at C4-C5 is due to motion artifact. Skull base and vertebrae: No acute fracture. No primary bone lesion or focal pathologic process. Soft tissues and spinal canal: No prevertebral fluid or swelling. No visible canal hematoma. Disc levels:  Normal. Upper chest: Negative. Other: None. IMPRESSION: 1. Normal noncontrast head and cervical spine CTs. 2. Apparent anterolisthesis at C4-C5 is due to motion artifact. Electronically Signed   By: Titus Dubin M.D.   On: 06/12/2021 19:14   US RENAL  Result Date: 06/09/2021 CLINICAL DATA:  Decreased urinary output.  C-section yesterday. EXAM: RENAL / URINARY TRACT ULTRASOUND COMPLETE COMPARISON:  None. FINDINGS: Right Kidney: Renal measurements: 10.9 x 4 x 6 cm = volume: 136 mL. Echogenicity within normal limits. No mass or hydronephrosis visualized. Left Kidney: Renal measurements: 11.6 x 5.3 x 4.9 cm = volume: 156 mL. Echogenicity within normal limits. There is a 7 mm stone. No mass or hydronephrosis visualized. Urinary bladder: Appears normal for degree of bladder distention. Other: Bilateral trace pleural effusions. IMPRESSION: 1. Nonobstructive 7 mm left nephrolithiasis. 2. Bilateral trace pleural effusions. Electronically Signed   By: Iven Finn M.D.   On: 06/09/2021 16:20    Assessment:  Carolyn Munoz is a 31 y.o. G41P1001 female with postpartum preeclampsia with severe features.   Plan:  1. Admit to Labor & Delivery; consents reviewed and obtained - Covid admission screen pending - Discussed plan of care with Dr Leafy Ro  2. Routine postpartum care  - Lactation consult PRN breastfeeding  - Continue routine postpartum medications   3. Postpartum Preeclampsia  - Bedrest with bedside commode privileges  - strict I/O - TED hose for  VTE prophylaxis  - Start 4 gram Magnesium sulfate IV bolus  - 2 gram Magnesium sulfate infusion for maintenance for 12-24 hours  - Continue hypertensive protocol for treatment of severe range blood pressures  - Total IV fluids 174ml/hr  - Repeat labs in AM    Francetta Found, CNM Certified Nurse Midwife [place]  [facility]

## 2021-06-12 NOTE — ED Notes (Signed)
Husband updated on wait.

## 2021-06-13 ENCOUNTER — Encounter: Payer: Self-pay | Admitting: Obstetrics and Gynecology

## 2021-06-13 LAB — COMPREHENSIVE METABOLIC PANEL
ALT: 31 U/L (ref 0–44)
AST: 37 U/L (ref 15–41)
Albumin: 2.5 g/dL — ABNORMAL LOW (ref 3.5–5.0)
Alkaline Phosphatase: 85 U/L (ref 38–126)
Anion gap: 7 (ref 5–15)
BUN: 11 mg/dL (ref 6–20)
CO2: 25 mmol/L (ref 22–32)
Calcium: 7.9 mg/dL — ABNORMAL LOW (ref 8.9–10.3)
Chloride: 106 mmol/L (ref 98–111)
Creatinine, Ser: 0.45 mg/dL (ref 0.44–1.00)
GFR, Estimated: >60 mL/min (ref >60)
Glucose, Bld: 95 mg/dL (ref 70–99)
Potassium: 3.6 mmol/L (ref 3.5–5.1)
Sodium: 138 mmol/L (ref 135–145)
Total Bilirubin: 0.3 mg/dL (ref 0.3–1.2)
Total Protein: 5.1 g/dL — ABNORMAL LOW (ref 6.5–8.1)

## 2021-06-13 LAB — CBC
HCT: 27.4 % — ABNORMAL LOW (ref 36.0–46.0)
Hemoglobin: 9.1 g/dL — ABNORMAL LOW (ref 12.0–15.0)
MCH: 32.2 pg (ref 26.0–34.0)
MCHC: 33.2 g/dL (ref 30.0–36.0)
MCV: 96.8 fL (ref 80.0–100.0)
Platelets: 243 10*3/uL (ref 150–400)
RBC: 2.83 MIL/uL — ABNORMAL LOW (ref 3.87–5.11)
RDW: 13.9 % (ref 11.5–15.5)
WBC: 7.4 10*3/uL (ref 4.0–10.5)
nRBC: 0 % (ref 0.0–0.2)

## 2021-06-13 LAB — PROTEIN / CREATININE RATIO, URINE
Creatinine, Urine: 15 mg/dL
Protein Creatinine Ratio: 0.93 mg/mg{Cre} — ABNORMAL HIGH (ref 0.00–0.15)
Total Protein, Urine: 14 mg/dL

## 2021-06-13 NOTE — Progress Notes (Signed)
Post Partum Day 5 (readmission)  Subjective: Doing well, no complaints.  Tolerating regular diet, pain with PO meds, voiding and ambulating without difficulty.  No CP SOB Fever,Chills, N/V or leg pain; denies nipple or breast pain Very mild HA now, no change of vision, RUQ/epigastric pain - TED hose remain in place.   Objective: BP 119/78    Pulse 78    Temp 98.2 F (36.8 C) (Oral)    Resp 18    SpO2 96%   Vitals:   06/12/21 2230 06/12/21 2300 06/12/21 2330 06/12/21 2345  BP: (!) 154/92 140/88 140/88 135/79   06/13/21 0046 06/13/21 0145 06/13/21 0246 06/13/21 0345  BP: 108/66 103/70 108/77 123/79   06/13/21 0445 06/13/21 0500 06/13/21 0546 06/13/21 0646  BP: 117/77 117/78 106/73 119/78    Intake/Output      01/07 0701 01/08 0700 01/08 0701 01/09 0700   I.V. 866.3    Total Intake 866.3    Urine 1950 350   Total Output 1950 350   Net -1083.7 -350         Mag sulfate at 2gm/hr continues.    Physical Exam:  General: NAD Breasts: soft/nontender, has been pumping.  CV: RRR Pulm: nl effort, CTABL Abdomen: soft, NT, BS x 4 Incision: Honeycomb Dsg CDI Lochia: small Uterine Fundus: fundus firm and 3 fb below umbilicus DVT Evaluation: no cords, ttp LEs   Recent Labs    06/12/21 1809 06/13/21 0618  HGB 9.7* 9.1*  HCT 28.8* 27.4*  WBC 6.8 7.4  PLT 215 243    Assessment/Plan: 31 y.o. G1P1001 postpartum day # 5 with readmission for PP preeclampsia.   - Continue routine PP care - Lactation consult prn - Preeclampsia: Continue Mag sulfate 12-24hrs  Repeat Labs reviewed wnl     Disposition: remain inpatient on Mag sulfate until brisk diuresis.     Francetta Found, CNM 06/13/2021  7:25 AM

## 2021-06-13 NOTE — Progress Notes (Signed)
Elevated P/C ratio reported to [place], [place].

## 2021-06-14 MED ORDER — NIFEDIPINE ER OSMOTIC RELEASE 30 MG PO TB24
30.0000 mg | ORAL_TABLET | Freq: Every day | ORAL | Status: DC
Start: 2021-06-14 — End: 2021-06-15
  Administered 2021-06-14 – 2021-06-15 (×2): 30 mg via ORAL
  Filled 2021-06-14 (×2): qty 1

## 2021-06-14 NOTE — Progress Notes (Addendum)
Post Partum Day 6 (readmission) Subjective: Doing well, no complaints.  Tolerating regular diet, pain with PO meds, voiding without difficulty.  No CP SOB Fever,Chills, N/V or leg pain; denies nipple or breast pain, states her mild headache improved with Tylenol and Ibuprofen, no change of vision, RUQ/epigastric pain  TED hose remain in place.  Objective: BP 139/85    Pulse 86    Temp 98.4 F (36.9 C) (Axillary)    Resp 16    Ht '5\' 4"'  (1.626 m)    Wt 62 kg    SpO2 95%    BMI 23.46 kg/m    Physical Exam:  General: NAD Breasts: soft/nontender CV: RRR Pulm: nl effort, CTABL Abdomen: soft, NT, BS x 4 Incision: Dsg CDI/Steristrips intact/no erythema or drainage Lochia: mild Uterine Fundus: fundus firm and 4 fb below umbilicus DVT Evaluation: no cords, ttp LEs   Recent Labs    06/12/21 1809 06/13/21 0618  HGB 9.7* 9.1*  HCT 28.8* 27.4*  WBC 6.8 7.4  PLT 215 243   I/O last 3 completed shifts: In: 63 [P.O.:480; I.V.:2931] Out: 98 [Urine:9750] Total I/O In: -  Out: 67 [Urine:1150]   Assessment/Plan: 31 y.o. G1P1001 postpartum day # 6 with readmission for PP preeclampsia  - Continue routine PP care - Lactation consult PRN, she is pumping   - Magnesium sulfate infusion completed at 0000 - Diuresing appropriately, swelling has gone down - BP 130s-140s/80s, starting Procardia 37m XL - BP check scheduled in the office for 06/15/21 - They are requesting OB/GYN to see them prior to discharge so Dr. JGlennon Macwill talk to them in person and review POC with them.   Disposition: Does desire Dc home today.    DGertie Fey CNM 06/14/2021 11:03 AM  I personally met with the patient and her husband.  She is feeling a lot better today.  She continues to have intermittent headaches that improved with Tylenol and ibuprofen.  She rates the initial headache as 6 out of 10.  With Tylenol and ibuprofen the headache is a 3 out of 10.  She denies changes in her vision and right  upper quadrant pain.  Today she has been started on nifedipine XL 30 mg.  BP 139/87    Pulse 73    Temp 99 F (37.2 C) (Oral)    Resp 16    Ht '5\' 4"'  (1.626 m)    Wt 62 kg    SpO2 99%    BMI 23.46 kg/m   Gen: NAD Pulm: CTAB CV: RRR Abd; soft, NT, ND, fundus at U-4  Incision: C/D/I with bandage in place (until POD#7) Ext: TED hose in place. No notable pitting edema.  A/P 31y.o. GG51P1001female who is POD#6 with readmission  for preeclampsia with severe features. After long discussion with the patient and her husband, we discussed discharge today with follow-up in clinic tomorrow to check her blood pressure and symptoms.  We also discussed that since she continues to have headaches occasionally and has been started on a new antihypertensive agent, it would be reasonable to monitor her in the hospital until her blood pressure is stable and her headache has resolved. We discussed that since she is breast-feeding she is able to bring in her newborn, which is a source of stress for her at this time it appears.I would lean toward staying in house longer.  We discussed that either option would be reasonable, though I would lean toward staying in-house longer.  She and  her husband would like to think about it and they will decide.   Prentice Docker, MD, [place] 06/14/2021 1:35 PM

## 2021-06-15 MED ORDER — NIFEDIPINE ER 30 MG PO TB24: 30.0000 mg | ORAL_TABLET | Freq: Every day | ORAL | 3 refills | Status: DC

## 2021-06-15 NOTE — Progress Notes (Signed)
Patient discharged, discharge instructions given to patient. All questions answered and appointments given.  Patient will be transported once transportation arrives.

## 2021-06-15 NOTE — Discharge Summary (Signed)
Obstetrical Discharge Summary  Patient Name: Carolyn Munoz DOB: 1990/06/19 MRN: [id number]  Date of Admission: 06/12/2021 Date of Delivery: 06/08/2021 Delivered by: Dr. Benjaman Kindler  Date of Discharge: 06/15/2021  Primary OB: [place] LMP:No LMP recorded. EDC Estimated Date of Delivery: None noted. 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  Asthma- no meds History of anxiety/depression- no meds  Admitting Diagnosis: Preeclampsia in postpartum period [O14.95]  Secondary Diagnosis: Patient Active Problem List   Diagnosis Date Noted   S/P cesarean section 06/10/2021   Preeclampsia in postpartum period 06/10/2021   Ovarian cyst, bilateral 12/17/2020    Postpartum Procedures: postpartum magnesium sulfate infusion x 24 hours  Edinburgh:  Edinburgh Postnatal Depression Scale Screening Tool 06/10/2021  I have been able to laugh and see the funny side of things. 0  I have looked forward with enjoyment to things. 1  I have blamed myself unnecessarily when things went wrong. 2  I have been anxious or worried for no good reason. 2  I have felt scared or panicky for no good reason. 1  Things have been getting on top of me. 2  I have been so unhappy that I have had difficulty sleeping. 0  I have felt sad or miserable. 2  I have been so unhappy that I have been crying. 1  The thought of harming myself has occurred to me. 0  Edinburgh Postnatal Depression Scale Total 11     Hospital course:  Carolyn Munoz is a 31 y.o. G1P1001 female at 5 days postpartum who presented to the ED with complaints of severe HA.  She had an uncomplicated primary LTCS for active phase arrest on 06/08/2021.  Carolyn Munoz was discharged home in stable condition on 06/10/2021 and did not require oral antihypertensives prior to discharge.  She reported the following symptoms of HA that started  Saturday and become progressively worse.  Lower extremity edema had been ongoing and she also had low urine output during her initial hospitalization. She was admitted for IV magnesium sulfate for 24 hours d/t postpartum preeclampsia and started on oral antihypertensives.  By discharge she had significant improvement in urine output, blood pressures were well controlled with Procardia 30mg  PO, and symptoms had improved.  She was deemed stable for discharge and instructed to follow up outpatient for blood pressure monitoring.   Discharge Physical Exam:  BP (!) 142/94 (BP Location: Right Arm)    Pulse 74    Temp 98.9 F (37.2 C) (Oral)    Resp 18    Ht 5\' 4"  (1.626 m)    Wt 62 kg    SpO2 100%    BMI 23.46 kg/m   General: NAD CV: RRR Pulm: CTABL, nl effort ABD: s/nd/nt, fundus firm and below the umbilicus Extremities: Bilateral 1+ pitting edema in LE Lochia: moderate Perineum: minimal edema/intact Incision: well approximated, no drainage or erythema, healing well DVT Evaluation: LE non-ttp, no evidence of DVT on exam.  Hemoglobin  Date Value Ref Range Status  06/13/2021 9.1 (L) 12.0 - 15.0 g/dL Final   HCT  Date Value Ref Range Status  06/13/2021 27.4 (L) 36.0 - 46.0 % Final     Disposition: stable, discharge to home. Baby Feeding: breastmilk   Prenatal Labs:  Blood type/Rh A pos  Antibody screen neg  Rubella Immune  Varicella Immune  RPR NR  HBsAg Neg  HIV NR  GC neg  Chlamydia neg  Genetic screening  declined  1 hour GTT 90  3 hour GTT N/a  GBS neg    Plan:  Carolyn Munoz was discharged to home in good condition. Follow-up appointment with [facility] in 1-2 days.  Discharge Medications: Allergies as of 06/15/2021       Reactions   Banana    Abdominal Pain        Medication List     TAKE these medications    acetaminophen 500 MG tablet Commonly known as: TYLENOL Take 2 tablets (1,000 mg total) by mouth every 6 (six) hours.   ferrous sulfate 325 (65 FE) MG  tablet Take 1 tablet (325 mg total) by mouth 2 (two) times daily with a meal.   menthol-cetylpyridinium 3 MG lozenge Commonly known as: CEPACOL Take 1 lozenge (3 mg total) by mouth every 2 (two) hours as needed for sore throat.   NIFEdipine 30 MG 24 hr tablet Commonly known as: ADALAT CC Take 1 tablet (30 mg total) by mouth daily.   oxyCODONE 5 MG immediate release tablet Commonly known as: Oxy IR/ROXICODONE Take 1 tablet (5 mg total) by mouth every 6 (six) hours as needed for severe pain.   PRENATAL GUMMIES PO Take 2 capsules by mouth daily.   simethicone 80 MG chewable tablet Commonly known as: MYLICON Chew 1 tablet (80 mg total) by mouth 3 (three) times daily.         Follow-up Information     Benjaman Kindler, MD. Go on 06/17/2021.   Specialty: Obstetrics and Gynecology Why: Appt is with Dr. Glennon Mac- 10:00 am Contact information: [place] [address] [phone]                 Signed:  Drinda Butts, CNM Certified Nurse Midwife [facility]

## 2021-06-18 ENCOUNTER — Emergency Department: Payer: BC Managed Care – PPO

## 2021-06-18 ENCOUNTER — Emergency Department
Admission: EM | Admit: 2021-06-18 | Discharge: 2021-06-18 | Disposition: A | Discharge status: Home / Self Care | Payer: BC Managed Care – PPO | Attending: Emergency Medicine | Admitting: Emergency Medicine

## 2021-06-18 ENCOUNTER — Other Ambulatory Visit: Payer: Self-pay

## 2021-06-18 DIAGNOSIS — R519 Headache, unspecified: Secondary | ICD-10-CM

## 2021-06-18 DIAGNOSIS — I10 Essential (primary) hypertension: Secondary | ICD-10-CM | POA: Diagnosis not present

## 2021-06-18 DIAGNOSIS — R202 Paresthesia of skin: Secondary | ICD-10-CM

## 2021-06-18 DIAGNOSIS — R4701 Aphasia: Secondary | ICD-10-CM | POA: Diagnosis present

## 2021-06-18 DIAGNOSIS — Z20822 Contact with and (suspected) exposure to covid-19: Secondary | ICD-10-CM | POA: Diagnosis not present

## 2021-06-18 LAB — COMPREHENSIVE METABOLIC PANEL
ALT: 23 U/L (ref 0–44)
AST: 21 U/L (ref 15–41)
Albumin: 3.4 g/dL — ABNORMAL LOW (ref 3.5–5.0)
Alkaline Phosphatase: 84 U/L (ref 38–126)
Anion gap: 8 (ref 5–15)
BUN: 14 mg/dL (ref 6–20)
CO2: 22 mmol/L (ref 22–32)
Calcium: 9.2 mg/dL (ref 8.9–10.3)
Chloride: 110 mmol/L (ref 98–111)
Creatinine, Ser: 0.47 mg/dL (ref 0.44–1.00)
GFR, Estimated: >60 mL/min (ref >60)
Glucose, Bld: 112 mg/dL — ABNORMAL HIGH (ref 70–99)
Potassium: 3.5 mmol/L (ref 3.5–5.1)
Sodium: 140 mmol/L (ref 135–145)
Total Bilirubin: 0.4 mg/dL (ref 0.3–1.2)
Total Protein: 6.5 g/dL (ref 6.5–8.1)

## 2021-06-18 LAB — URINALYSIS, COMPLETE (UACMP) WITH MICROSCOPIC
Bilirubin Urine: NEGATIVE
Glucose, UA: NEGATIVE mg/dL
Ketones, ur: NEGATIVE mg/dL
Nitrite: NEGATIVE
Protein, ur: NEGATIVE mg/dL
RBC / HPF: >50 RBC/hpf (ref 0–5)
Specific Gravity, Urine: 1.02 (ref 1.005–1.030)
Squamous Epithelial / HPF: NONE SEEN (ref 0–5)
pH: 6.5 (ref 5.0–8.0)

## 2021-06-18 LAB — CBC
HCT: 32.7 % — ABNORMAL LOW (ref 36.0–46.0)
Hemoglobin: 11 g/dL — ABNORMAL LOW (ref 12.0–15.0)
MCH: 32.6 pg (ref 26.0–34.0)
MCHC: 33.6 g/dL (ref 30.0–36.0)
MCV: 97 fL (ref 80.0–100.0)
Platelets: 381 10*3/uL (ref 150–400)
RBC: 3.37 MIL/uL — ABNORMAL LOW (ref 3.87–5.11)
RDW: 13.9 % (ref 11.5–15.5)
WBC: 6.7 10*3/uL (ref 4.0–10.5)
nRBC: 0 % (ref 0.0–0.2)

## 2021-06-18 LAB — RESP PANEL BY RT-PCR (FLU A&B, COVID) ARPGX2
Influenza A by PCR: NEGATIVE
Influenza B by PCR: NEGATIVE
SARS Coronavirus 2 by RT PCR: NEGATIVE

## 2021-06-18 IMAGING — CT CT ANGIO HEAD-NECK (W OR W/O PERF)
1 of 11 series · 5 of 35 positions shown · IV contrast (APPLIED)
Comparison: None.

CLINICAL DATA: Preeclampsia with multiple neurologic symptoms.

EXAM:
CT ANGIOGRAPHY HEAD AND NECK
TECHNIQUE: Multidetector CT imaging of the head and neck was performed using
the standard protocol during bolus administration of intravenous
contrast. Multiplanar CT image reconstructions and MIPs were
obtained to evaluate the vascular anatomy. Carotid stenosis
measurements (when applicable) are obtained utilizing NASCET
criteria, using the distal internal carotid diameter as the
denominator.

[Series 15: ax thin · axial · 0.44mm/px · z∈[-331,-114]mm · 5 of 348 slices shown]
[im 58/348  soft-tissue]
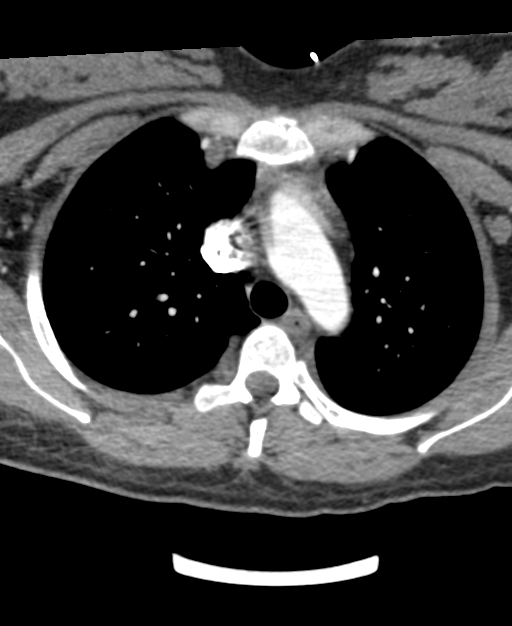
[im 116/348  bone]
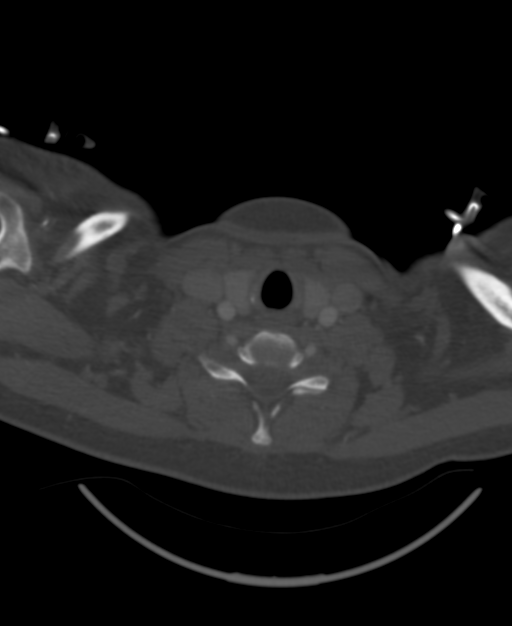
[im 174/348  soft-tissue]
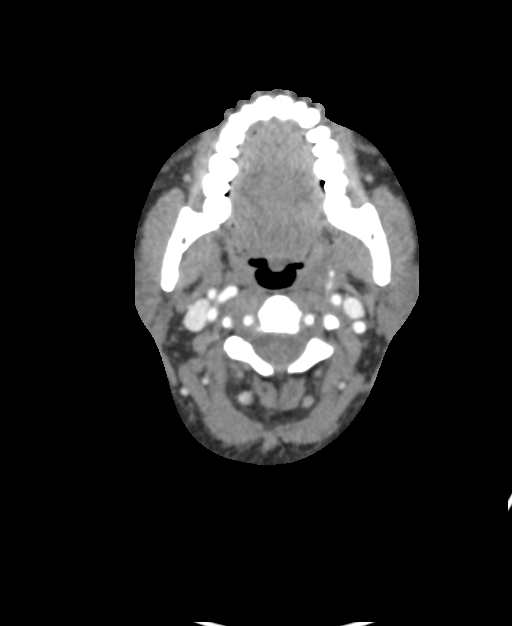
[im 232/348  bone]
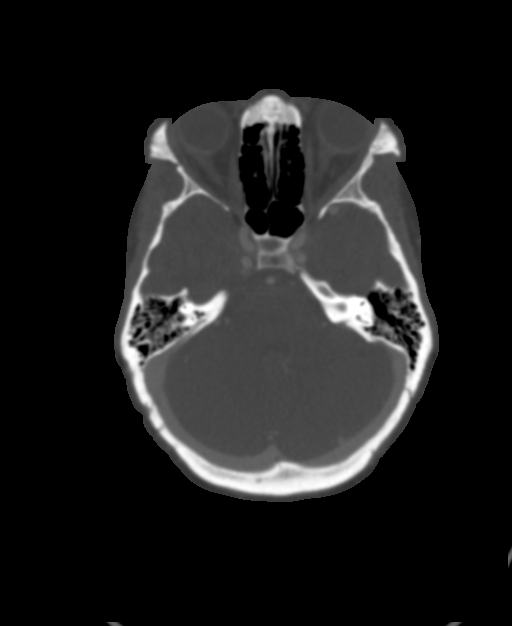
[im 290/348  soft-tissue]
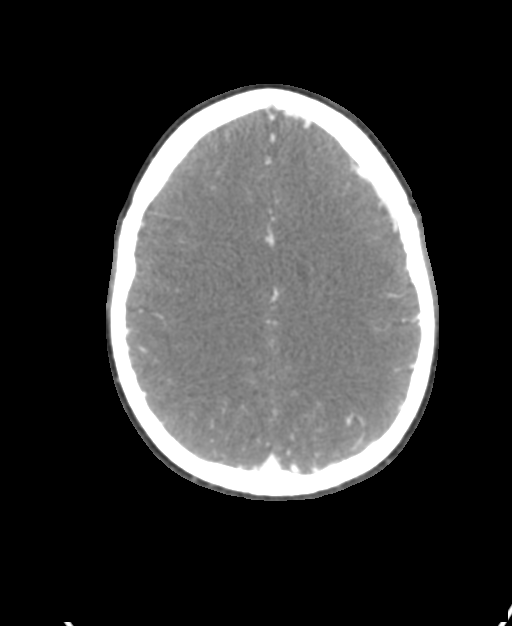

[5 of 35 positions shown; findings below may reference images not displayed]

RADIATION DOSE REDUCTION: This exam was performed according to the
departmental dose-optimization program which includes automated
exposure control, adjustment of the mA and/or kV according to
patient size and/or use of iterative reconstruction technique.

CONTRAST:  75mL OMNIPAQUE IOHEXOL 350 MG/ML SOLN
FINDINGS: CT HEAD FINDINGS

Brain: There is no mass, hemorrhage or extra-axial collection. The
size and configuration of the ventricles and extra-axial CSF spaces
are normal. There is no acute or chronic infarction. The brain
parenchyma is normal.

Skull: The visualized skull base, calvarium and extracranial soft
tissues are normal.

Sinuses/Orbits: No fluid levels or advanced mucosal thickening of
the visualized paranasal sinuses. No mastoid or middle ear effusion.
The orbits are normal.

CTA NECK FINDINGS

SKELETON: There is no bony spinal canal stenosis. No lytic or
blastic lesion.

OTHER NECK: Normal pharynx, larynx and major salivary glands. No
cervical lymphadenopathy. Unremarkable thyroid gland.

UPPER CHEST: No pneumothorax or pleural effusion. No nodules or
masses.

AORTIC ARCH:

There is no calcific atherosclerosis of the aortic arch. There is no
aneurysm, dissection or hemodynamically significant stenosis of the
visualized portion of the aorta. Conventional 3 vessel aortic
branching pattern. The visualized proximal subclavian arteries are
widely patent.

RIGHT CAROTID SYSTEM: Normal without aneurysm, dissection or
stenosis.

LEFT CAROTID SYSTEM: Normal without aneurysm, dissection or
stenosis.

VERTEBRAL ARTERIES: Left dominant configuration. Both origins are
clearly patent. There is no dissection, occlusion or flow-limiting
stenosis to the skull base (V1-V3 segments).

CTA HEAD FINDINGS

POSTERIOR CIRCULATION:

--Vertebral arteries: Normal V4 segments.

--Inferior cerebellar arteries: Normal.

--Basilar artery: Normal.

--Superior cerebellar arteries: Normal.

--Posterior cerebral arteries (PCA): Normal.

ANTERIOR CIRCULATION:

--Intracranial internal carotid arteries: Normal.

--Anterior cerebral arteries (ACA): Normal. Both A1 segments are
present. Patent anterior communicating artery (a-comm).

--Middle cerebral arteries (MCA): Normal.

VENOUS SINUSES: As permitted by contrast timing, patent.

ANATOMIC VARIANTS: None

Review of the MIP images confirms the above findings.
IMPRESSION: Normal CTA of the head and neck.

## 2021-06-18 IMAGING — MR MR MRV HEAD WO/W CM
4 of 6 series · 11 of 48 positions shown · IV contrast (gadavist)
Comparison: No pertinent prior exam.

CLINICAL DATA: Dural venous sinus thrombosis suspected

EXAM:
MR VENOGRAM HEAD WITHOUT AND WITH CONTRAST
TECHNIQUE: Angiographic images of the intracranial venous structures were
acquired using MRV technique without and with intravenous contrast.
CONTRAST:  7.5mL GADAVIST GADOBUTROL 1 MMOL/ML IV SOLN

[Series 9: TOF · coronal · 2.5mm · 0.98mm/px · 3 of 128 slices shown]
[im 16/128]
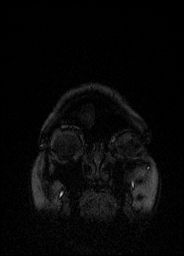
[im 64/128]
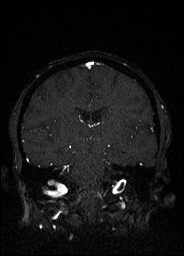
[im 112/128]
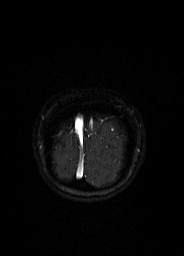

[Series 16: T1 · sagittal · 1.0mm · 0.98mm/px · 3 of 160 slices shown]
[im 29/160]
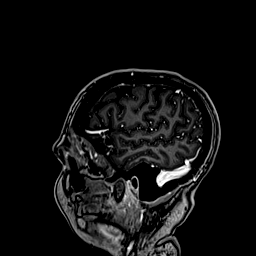
[im 87/160]
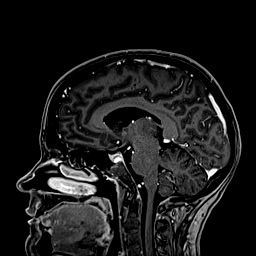
[im 145/160]
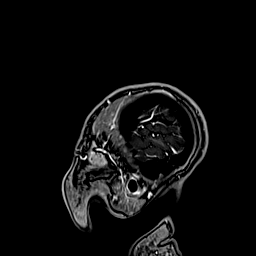

[Series 1031: cor p-a post · coronal · 1.0mm · 0.22mm/px · 3 of 117 slices shown]
[im 17/117]
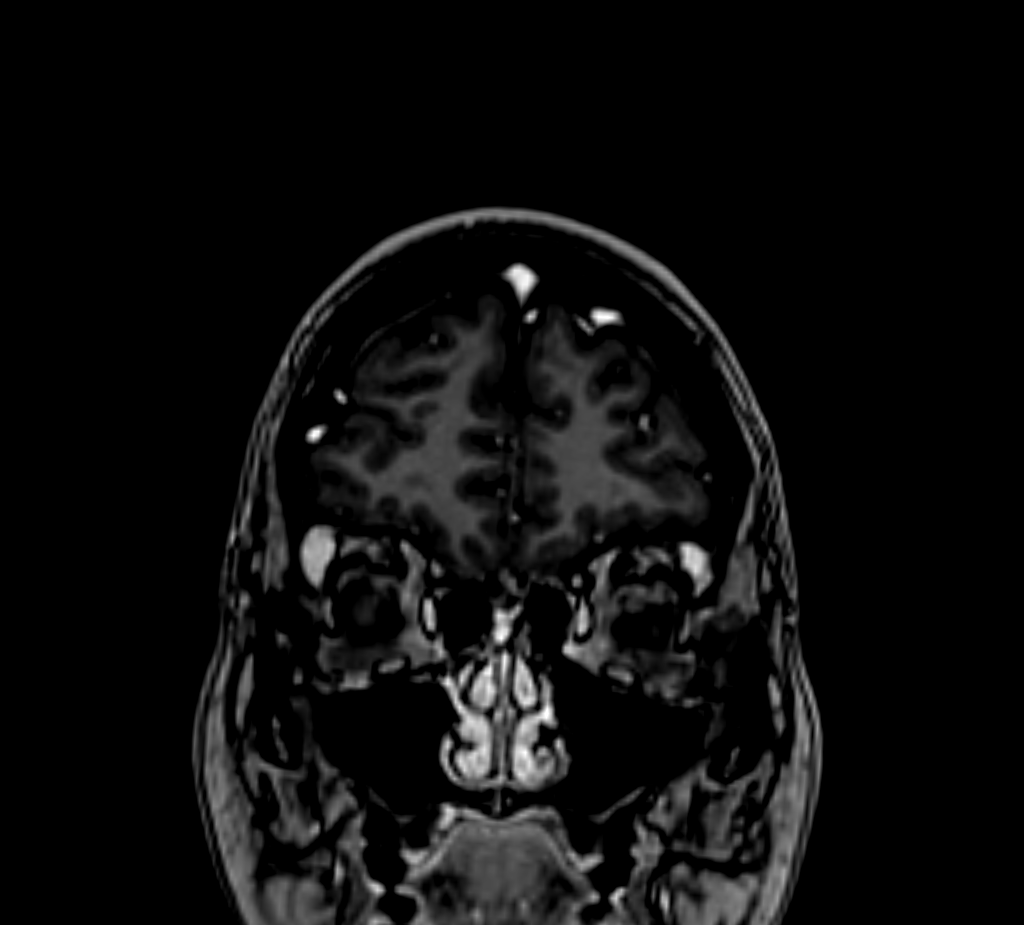
[im 67/117]
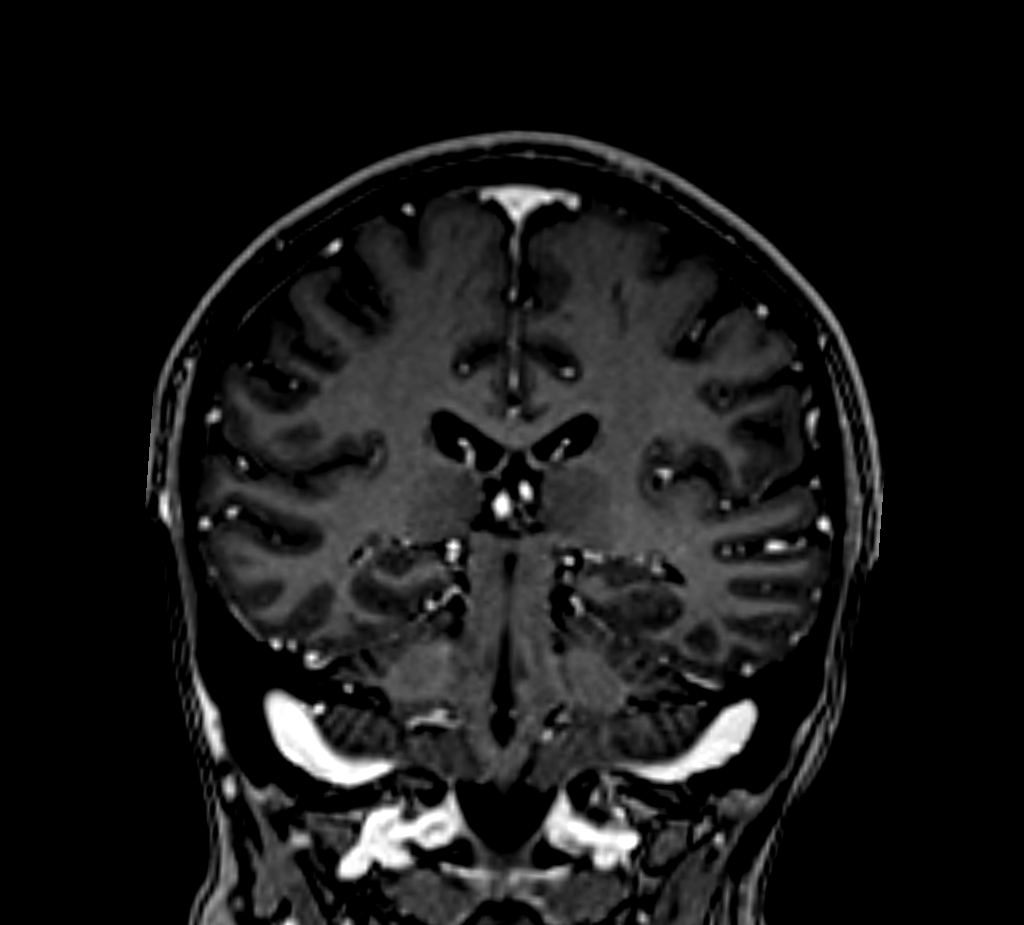
[im 100/117]
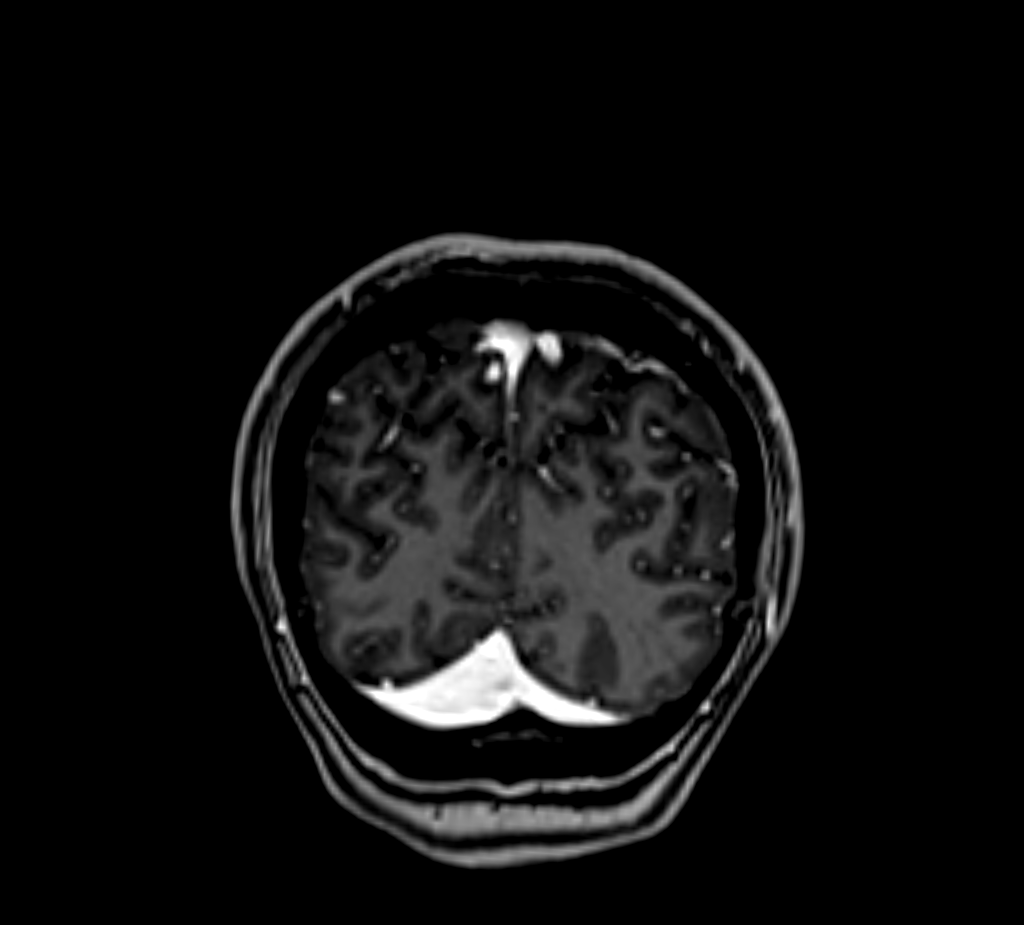

[Series 1043: ax post h-f · axial · 1.0mm · 0.24mm/px · z∈[-133,-73]mm · 2 of 155 slices shown]
[im 16/155]
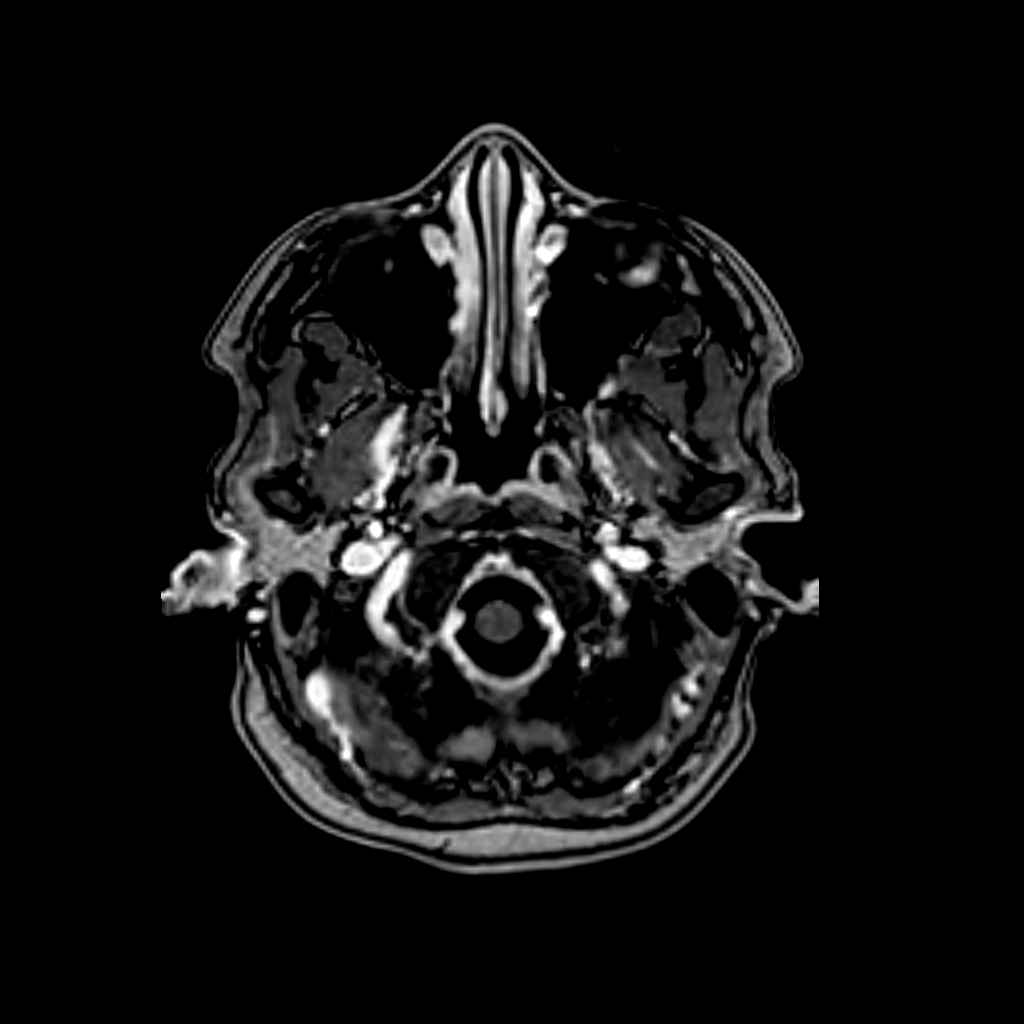
[im 78/155]
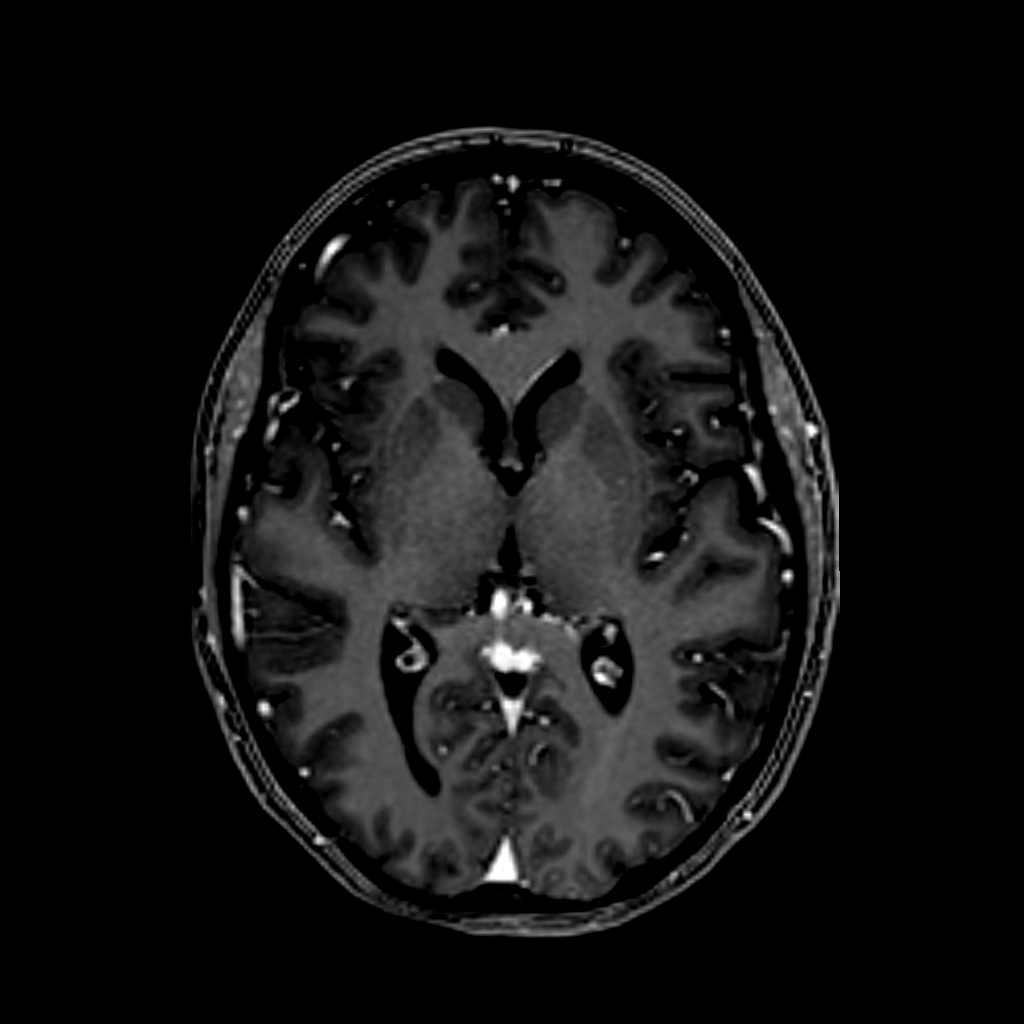

[11 of 48 positions shown; findings below may reference images not displayed]

FINDINGS: Superior sagittal sinus: Normal.

Straight sinus: Normal.

Inferior sagittal sinus, vein of NYOKA and internal cerebral veins:
Normal.

Transverse sinuses: Normal.

Sigmoid sinuses: Normal.

Visualized jugular veins: Normal.
IMPRESSION: Normal MRV of the brain. No evidence of dural venous sinus
thrombosis.

## 2021-06-18 MED ORDER — IOHEXOL 350 MG/ML SOLN
75.0000 mL | Freq: Once | INTRAVENOUS | Status: AC | PRN
  Administered 2021-06-18: 75 mL via INTRAVENOUS

## 2021-06-18 MED ORDER — GADOBUTROL 1 MMOL/ML IV SOLN
6.0000 mL | Freq: Once | INTRAVENOUS | Status: AC | PRN
  Administered 2021-06-18: 7.5 mL via INTRAVENOUS

## 2021-06-18 NOTE — ED Notes (Signed)
RN to bedside to introduce self to pt. Pt CAOx4 and in no acute distress.

## 2021-06-18 NOTE — ED Notes (Signed)
Pt returned from CT °

## 2021-06-18 NOTE — ED Notes (Signed)
Pt in CT at this time.

## 2021-06-18 NOTE — ED Notes (Signed)
Pt discharge information reviewed. Pt understands importance for need of follow up care, and when to return if symptoms worsen.  Pt understands all information. All questions answered. Pt seen ambulating out of department with strong steady gait.

## 2021-06-18 NOTE — ED Triage Notes (Signed)
Pt reports that she was admitted this past weekend and placed on a mag drip for preeclampsia, this am around 0800 she states that she started having some difficulty with her words, feeling some numbness in her hand, blurred vision, pt doubled her bp med and noticed that the blurred vision and numbness got better but states that she has cont to have difficulty finding her words and cont to have difficulty at this time, pt is 10 days post partum

## 2021-06-18 NOTE — ED Notes (Signed)
Breast pump brought to pt

## 2021-06-18 NOTE — Discharge Instructions (Addendum)
You have been seen in the emergency department today for word finding difficulty and paresthesias/tingling.  Your work-up does not show any concerning abnormalities.  Please continue to closely monitor your blood pressure at home.  Please follow-up with your OB/GYN on Monday for recheck/reevaluation.  Return to the emergency department immediately for any further weakness or numbness confusion or any other symptom personally concerning to yourself.

## 2021-06-18 NOTE — ED Notes (Signed)
Charge notified for need of breast pump

## 2021-06-18 NOTE — ED Provider Notes (Signed)
[facility] Provider Note    Event Date/Time   First MD Initiated Contact with Patient 06/18/21 1629     (approximate)  History   Chief Complaint: Aphasia and Postpartum Complications  HPI  Carolyn Munoz is a 31 y.o. female with a past medical history recent delivery 10 days ago complicated by preeclampsia last week who presents to the emergency department for difficulty speaking.  According to the patient she was admitted recently for preeclampsia/high blood pressure placed on the magnesium infusion.  States she has been feeling better at home, but around 8:00 this morning she felt some numbness in her right hand and some blurred vision.  Patient checked her blood pressure and it was 140 so she doubled her blood pressure medication as instructed by her OB.  Patient states the numbness seemed to improve but she began having significant word finding difficulty which the husband who is here with the patient states is extremely atypical.  Patient currently denies any weakness or numbness, but does have obvious word finding difficulty.  Physical Exam   Triage Vital Signs: ED Triage Vitals  Enc Vitals Group     BP 06/18/21 1619 (!) 128/93     Pulse Rate 06/18/21 1619 (!) 124     Resp 06/18/21 1619 16     Temp 06/18/21 1619 98.8 F (37.1 C)     Temp Source 06/18/21 1619 Oral     SpO2 06/18/21 1619 100 %     Weight 06/18/21 1620 136 lb 11 oz (62 kg)     Height 06/18/21 1620 5\' 4"  (1.626 m)     Head Circumference --      Peak Flow --      Pain Score 06/18/21 1620 1     Pain Loc --      Pain Edu? --      Excl. in [place]? --     Most recent vital signs: Vitals:   06/18/21 1619  BP: (!) 128/93  Pulse: (!) 124  Resp: 16  Temp: 98.8 F (37.1 C)  SpO2: 100%    General: Awake, no distress.  CV:  Good peripheral perfusion.  Regular rhythm rate around 120 bpm.  No obvious murmur. Resp:  Normal effort.  Equal breath sounds bilaterally.  Abd:  No distention.  Soft,  nontender.  No rebound or guarding.  No significant tenderness. Other:  Equal grip strength bilaterally.  No pronator drift.  5/5 motor in all extremities.  No cranial nerve deficits.  Pupils equal and reactive.  Patient does at times have word finding difficulty during my evaluation.   ED Results / Procedures / Treatments    RADIOLOGY  I have reviewed the MRI/MRV images.  I do not see any obvious dural thrombosis on my examination waiting for radiology read. Radiology has read the MR the of the brain is negative. CTA of the head and neck is negative.   MEDICATIONS ORDERED IN ED: Medications - No data to display   IMPRESSION / MDM / [place] / ED COURSE  I reviewed the triage vital signs and the nursing notes.  Patient presents emergency department 10 days after a delivery with word finding difficulty numbness and hypertension this morning.  Patient was recently admitted for preeclampsia.  I reviewed the patient's discharge summary from 06/15/2021.  Patient had a C-section 06/08/2021, was readmitted to the hospital 06/12/2021 for preeclampsia kept on a magnesium infusion ultimately discharged 06/15/2021.  Discharged on nifedipine.  Given the  patient's presentation with word finding difficulty and numbness this morning this raises concern for several possibilities including central venous thrombosis/dural thrombosis, CVA, press.  We will check labs, urinalysis, EKG, obtain an MR/MRV of the brain and continue to closely monitor.  Patient agreeable to plan of care.  Once results are known we will discuss with OB.  Patient's MRI/MRV is negative.  Labs are reassuring.  Urinalysis pending.  I spoke to [facility] regarding the patient.  I spoke to [facility] who states nothing further from [facility] standpoint offer for the patient.  Given the patient's word finding difficulty I spoke with neurology Dr. Theda Sers who actually came down and evaluated the patient.  He believes her symptoms could have  been consistent with a migraine but that she is feeling and looking much better he believes we should obtain a CTA of the head and neck to rule out aneurysm, if negative he believes patient could be discharged home.  CTA has resulted negative.  Given the reassuring work-up negative MRI negative CTA I believe the patient is safe for discharge home with OB follow-up.  Patient agreeable to plan of care.  FINAL CLINICAL IMPRESSION(S) / ED DIAGNOSES   Aphasia Paresthesia  Rx / DC Orders   OB/GYN follow-up  Note:  This document was prepared using Dragon voice recognition software and may include unintentional dictation errors.   Harvest Dark, MD 06/18/21 2243

## 2021-07-21 ENCOUNTER — Encounter: Payer: Self-pay | Admitting: Emergency Medicine

## 2021-07-21 ENCOUNTER — Ambulatory Visit
Admission: EM | Admit: 2021-07-21 | Discharge: 2021-07-21 | Disposition: A | Discharge status: Home / Self Care | Payer: BC Managed Care – PPO

## 2021-07-21 ENCOUNTER — Other Ambulatory Visit: Payer: Self-pay

## 2021-07-21 DIAGNOSIS — B349 Viral infection, unspecified: Secondary | ICD-10-CM

## 2021-07-21 LAB — POCT RAPID STREP A (OFFICE): Rapid Strep A Screen: NEGATIVE

## 2021-07-21 MED ORDER — BENZONATATE 100 MG PO CAPS: 100.0000 mg | ORAL_CAPSULE | Freq: Three times a day (TID) | ORAL | 0 refills | Status: DC | PRN

## 2021-07-21 NOTE — ED Triage Notes (Signed)
Pt c/o cough, SOB, bodyaches, chest congestion, and ST sxs started yesterday

## 2021-07-21 NOTE — ED Provider Notes (Signed)
[facility]    CSN: [id number] Arrival date & time: 07/21/21  1710      History   Chief Complaint Chief Complaint  Patient presents with   Cough   Sore Throat   Generalized Body Aches    HPI Carolyn Munoz is a 31 y.o. female.  Patient presents with 1 day history of body aches, sore throat, congestion, cough, shortness of breath.  She denies fever, chills, rash, wheezing, vomiting, diarrhea, or other symptoms.  Treatment at home with Advil.  Her medical history includes asthma. She denies current pregnancy or breastfeeding.     The history is provided by the patient and medical records.   Past Medical History:  Diagnosis Date   Anxiety    Asthma    age teens to age 30   Bicuspid aortic valve    Complex ovarian cyst    Depression     Patient Active Problem List   Diagnosis Date Noted   S/P cesarean section 06/10/2021   Preeclampsia in postpartum period 06/10/2021   Ovarian cyst, bilateral 12/17/2020    Past Surgical History:  Procedure Laterality Date   APPENDECTOMY  2007   CESAREAN SECTION  06/08/2021   Procedure: CESAREAN SECTION;  Surgeon: Benjaman Kindler, MD;  Location: [facility] ORS;  Service: Obstetrics;;   OVARIAN CYST REMOVAL Bilateral 12/17/2020   Procedure: Open OVARIAN CYSTECTOMY with pelvic washings;  Surgeon: Benjaman Kindler, MD;  Location: [facility] ORS;  Service: Gynecology;  Laterality: Bilateral;    OB History     Gravida  1   Para  1   Term  1   Preterm      AB      Living  1      SAB      IAB      Ectopic      Multiple  0   Live Births  1            Home Medications    Prior to Admission medications   Medication Sig Start Date End Date Taking? Authorizing Provider  benzonatate (TESSALON) 100 MG capsule Take 1 capsule (100 mg total) by mouth 3 (three) times daily as needed for cough. 07/21/21  Yes Sharion Balloon, NP  citalopram (CELEXA) 10 MG tablet Take 10 mg by mouth daily. 07/05/21  Yes [provider]   ferrous sulfate 325 (65 FE) MG tablet Take 1 tablet (325 mg total) by mouth 2 (two) times daily with a meal. 06/10/21  Yes Linda Hedges, CNM  Prenatal MV & Min w/FA-DHA (PRENATAL GUMMIES PO) Take 2 capsules by mouth daily.   Yes [provider]  acetaminophen (TYLENOL) 500 MG tablet Take 2 tablets (1,000 mg total) by mouth every 6 (six) hours. 06/10/21   Linda Hedges, CNM  ibuprofen (ADVIL) 200 MG tablet Take 200 mg by mouth every 6 (six) hours as needed for pain.    [provider]  menthol-cetylpyridinium (CEPACOL) 3 MG lozenge Take 1 lozenge (3 mg total) by mouth every 2 (two) hours as needed for sore throat. 12/18/20   McVey, Murray Hodgkins, CNM  NIFEdipine (ADALAT CC) 30 MG 24 hr tablet Take 1 tablet (30 mg total) by mouth daily. 06/15/21   Minda Meo, CNM  oxyCODONE (OXY IR/ROXICODONE) 5 MG immediate release tablet Take 1 tablet (5 mg total) by mouth every 6 (six) hours as needed for severe pain. 06/10/21   Linda Hedges, CNM  simethicone (MYLICON) 80 MG chewable tablet Chew 1 tablet (  80 mg total) by mouth 3 (three) times daily. 12/18/20   McVey, Murray Hodgkins, CNM    Family History Family History  Problem Relation Age of Onset   Heart disease Mother    Breast cancer Mother     Social History Social History   Tobacco Use   Smoking status: Never   Smokeless tobacco: Never  Vaping Use   Vaping Use: Never used  Substance Use Topics   Alcohol use: Not Currently   Drug use: Never     Allergies   Banana   Review of Systems Review of Systems  Constitutional:  Negative for chills and fever.  HENT:  Positive for congestion and sore throat. Negative for ear pain.   Respiratory:  Positive for cough and shortness of breath. Negative for wheezing.   Cardiovascular:  Negative for chest pain and palpitations.  Gastrointestinal:  Negative for diarrhea and vomiting.  Skin:  Negative for color change and rash.  All other systems reviewed and are negative.   Physical  Exam Triage Vital Signs ED Triage Vitals  Enc Vitals Group     BP      Pulse      Resp      Temp      Temp src      SpO2      Weight      Height      Head Circumference      Peak Flow      Pain Score      Pain Loc      Pain Edu?      Excl. in [place]?    No data found.  Updated Vital Signs BP 102/71    Pulse 84    Temp 98.2 F (36.8 C)    Resp 18    SpO2 97%    Breastfeeding No   Visual Acuity Right Eye Distance:   Left Eye Distance:   Bilateral Distance:    Right Eye Near:   Left Eye Near:    Bilateral Near:     Physical Exam Vitals and nursing note reviewed.  Constitutional:      General: She is not in acute distress.    Appearance: Normal appearance. She is well-developed. She is not ill-appearing.  HENT:     Right Ear: Tympanic membrane normal.     Left Ear: Tympanic membrane normal.     Nose: Nose normal.     Mouth/Throat:     Mouth: Mucous membranes are moist.     Pharynx: Oropharynx is clear.  Cardiovascular:     Rate and Rhythm: Normal rate and regular rhythm.     Heart sounds: Normal heart sounds.  Pulmonary:     Effort: Pulmonary effort is normal. No respiratory distress.     Breath sounds: Normal breath sounds. No wheezing.  Abdominal:     Palpations: Abdomen is soft.     Tenderness: There is no abdominal tenderness.  Musculoskeletal:     Cervical back: Neck supple.  Skin:    General: Skin is warm and dry.  Neurological:     Mental Status: She is alert.  Psychiatric:        Mood and Affect: Mood normal.        Behavior: Behavior normal.     UC Treatments / Results  Labs (all labs ordered are listed, but only abnormal results are displayed) Labs Reviewed  COVID-19, FLU A+B NAA  POCT RAPID STREP A (OFFICE)    EKG  Radiology No results found.  Procedures Procedures (including critical care time)  Medications Ordered in UC Medications - No data to display  Initial Impression / Assessment and Plan / UC Course  I have reviewed  the triage vital signs and the nursing notes.  Pertinent labs & imaging results that were available during my care of the patient were reviewed by me and considered in my medical decision making (see chart for details).    Viral illness.  Rapid strep negative.  COVID and Flu pending.  Instructed patient to self quarantine per CDC guidelines.  Treating cough with Tessalon Perles.  Discussed symptomatic treatment including Tylenol or ibuprofen, rest, hydration.  Instructed patient to follow up with PCP if symptoms are not improving.  Patient agrees to plan of care.   Final Clinical Impressions(s) / UC Diagnoses   Final diagnoses:  Viral illness     Discharge Instructions      Your strep test is negative.  Your COVID and Flu tests are pending.  You should self quarantine until the test results are back.    Take the [facility] as directed for cough.  Take Tylenol or ibuprofen as needed for fever or discomfort.  Rest and keep yourself hydrated.    Follow-up with your primary care provider if your symptoms are not improving.         ED Prescriptions     Medication Sig Dispense Auth. Provider   benzonatate (TESSALON) 100 MG capsule Take 1 capsule (100 mg total) by mouth 3 (three) times daily as needed for cough. 21 capsule Sharion Balloon, NP      PDMP not reviewed this encounter.   Sharion Balloon, NP 07/21/21 1756

## 2021-07-21 NOTE — Discharge Instructions (Addendum)
Your strep test is negative.  Your COVID and Flu tests are pending.  You should self quarantine until the test results are back.    Take the [facility] as directed for cough.  Take Tylenol or ibuprofen as needed for fever or discomfort.  Rest and keep yourself hydrated.    Follow-up with your primary care provider if your symptoms are not improving.

## 2021-07-22 LAB — COVID-19, FLU A+B NAA
Influenza A, NAA: NOT DETECTED
Influenza B, NAA: NOT DETECTED
SARS-CoV-2, NAA: NOT DETECTED

## 2021-07-26 ENCOUNTER — Encounter: Payer: Self-pay | Admitting: Emergency Medicine

## 2021-07-26 ENCOUNTER — Ambulatory Visit
Admission: EM | Admit: 2021-07-26 | Discharge: 2021-07-26 | Disposition: A | Discharge status: Home / Self Care | Payer: BC Managed Care – PPO | Attending: Emergency Medicine | Admitting: Emergency Medicine

## 2021-07-26 ENCOUNTER — Other Ambulatory Visit: Payer: Self-pay

## 2021-07-26 DIAGNOSIS — J0100 Acute maxillary sinusitis, unspecified: Secondary | ICD-10-CM | POA: Diagnosis not present

## 2021-07-26 MED ORDER — AMOXICILLIN 875 MG PO TABS: 875.0000 mg | ORAL_TABLET | Freq: Two times a day (BID) | ORAL | 0 refills | Status: AC

## 2021-07-26 NOTE — ED Triage Notes (Signed)
Pt seen 2/15 she is not better. Pt c/o cough, ST and fever.

## 2021-07-26 NOTE — Discharge Instructions (Addendum)
Take the amoxicillin as directed.  Follow up with your primary care provider if your symptoms are not improving.   ° ° °

## 2021-07-26 NOTE — ED Provider Notes (Signed)
Roderic Palau    CSN: [id number] Arrival date & time: 07/26/21  1659      History   Chief Complaint Chief Complaint  Patient presents with   Cough   Fever    HPI Carolyn Munoz is a 31 y.o. female.  Patient presents with ongoing cough, postnasal drip, congestion, runny nose, sore throat.  She reports low-grade fever for the past 3 days.  She denies rash, wheezing, shortness of breath, vomiting, diarrhea, or other symptoms patient was seen here on 07/21/2021; diagnosed with viral illness; treated with Tessalon Perles; tested negative for strep, COVID, and flu.  Her medical history includes asthma.  She denies current pregnancy or breastfeeding.     The history is provided by the patient, the spouse and medical records.   Past Medical History:  Diagnosis Date   Anxiety    Asthma    age teens to age 24   Bicuspid aortic valve    Complex ovarian cyst    Depression     Patient Active Problem List   Diagnosis Date Noted   S/P cesarean section 06/10/2021   Preeclampsia in postpartum period 06/10/2021   Ovarian cyst, bilateral 12/17/2020    Past Surgical History:  Procedure Laterality Date   APPENDECTOMY  2007   CESAREAN SECTION  06/08/2021   Procedure: CESAREAN SECTION;  Surgeon: Benjaman Kindler, MD;  Location: [facility] ORS;  Service: Obstetrics;;   OVARIAN CYST REMOVAL Bilateral 12/17/2020   Procedure: Open OVARIAN CYSTECTOMY with pelvic washings;  Surgeon: Benjaman Kindler, MD;  Location: [facility] ORS;  Service: Gynecology;  Laterality: Bilateral;    OB History     Gravida  1   Para  1   Term  1   Preterm      AB      Living  1      SAB      IAB      Ectopic      Multiple  0   Live Births  1            Home Medications    Prior to Admission medications   Medication Sig Start Date End Date Taking? Authorizing Provider  amoxicillin (AMOXIL) 875 MG tablet Take 1 tablet (875 mg total) by mouth 2 (two) times daily for 10 days. 07/26/21 08/05/21 Yes  Sharion Balloon, NP  acetaminophen (TYLENOL) 500 MG tablet Take 2 tablets (1,000 mg total) by mouth every 6 (six) hours. 06/10/21   Linda Hedges, CNM  benzonatate (TESSALON) 100 MG capsule Take 1 capsule (100 mg total) by mouth 3 (three) times daily as needed for cough. 07/21/21   Sharion Balloon, NP  citalopram (CELEXA) 10 MG tablet Take 10 mg by mouth daily. 07/05/21   [provider]  ferrous sulfate 325 (65 FE) MG tablet Take 1 tablet (325 mg total) by mouth 2 (two) times daily with a meal. 06/10/21   Linda Hedges, CNM  ibuprofen (ADVIL) 200 MG tablet Take 200 mg by mouth every 6 (six) hours as needed for pain.    [provider]  menthol-cetylpyridinium (CEPACOL) 3 MG lozenge Take 1 lozenge (3 mg total) by mouth every 2 (two) hours as needed for sore throat. 12/18/20   McVey, Murray Hodgkins, CNM  NIFEdipine (ADALAT CC) 30 MG 24 hr tablet Take 1 tablet (30 mg total) by mouth daily. 06/15/21   Minda Meo, CNM  oxyCODONE (OXY IR/ROXICODONE) 5 MG immediate release tablet Take 1 tablet (5 mg total) by  mouth every 6 (six) hours as needed for severe pain. 06/10/21   Linda Hedges, CNM  Prenatal MV & Min w/FA-DHA (PRENATAL GUMMIES PO) Take 2 capsules by mouth daily.    [provider]  simethicone (MYLICON) 80 MG chewable tablet Chew 1 tablet (80 mg total) by mouth 3 (three) times daily. 12/18/20   McVey, Murray Hodgkins, CNM    Family History Family History  Problem Relation Age of Onset   Heart disease Mother    Breast cancer Mother     Social History Social History   Tobacco Use   Smoking status: Never   Smokeless tobacco: Never  Vaping Use   Vaping Use: Never used  Substance Use Topics   Alcohol use: Not Currently   Drug use: Never     Allergies   Banana   Review of Systems Review of Systems  Constitutional:  Positive for fever. Negative for chills.  HENT:  Positive for congestion, postnasal drip, rhinorrhea and sore throat. Negative for ear pain.   Eyes:   Negative for visual disturbance.  Respiratory:  Positive for cough. Negative for shortness of breath and wheezing.   Cardiovascular:  Negative for chest pain and palpitations.  Gastrointestinal:  Negative for diarrhea and vomiting.  Skin:  Negative for color change and rash.  All other systems reviewed and are negative.   Physical Exam Triage Vital Signs ED Triage Vitals  Enc Vitals Group     BP      Pulse      Resp      Temp      Temp src      SpO2      Weight      Height      Head Circumference      Peak Flow      Pain Score      Pain Loc      Pain Edu?      Excl. in [place]?    No data found.  Updated Vital Signs BP 117/79    Pulse 97    Temp 98.1 F (36.7 C)    Resp 18    SpO2 97%    Breastfeeding No   Visual Acuity Right Eye Distance:   Left Eye Distance:   Bilateral Distance:    Right Eye Near:   Left Eye Near:    Bilateral Near:     Physical Exam Vitals and nursing note reviewed.  Constitutional:      General: She is not in acute distress.    Appearance: She is well-developed.  HENT:     Right Ear: Tympanic membrane normal.     Left Ear: Tympanic membrane normal.     Nose: Congestion and rhinorrhea present.     Mouth/Throat:     Mouth: Mucous membranes are moist.     Pharynx: Oropharynx is clear.  Cardiovascular:     Rate and Rhythm: Normal rate and regular rhythm.     Heart sounds: Normal heart sounds.  Pulmonary:     Effort: Pulmonary effort is normal. No respiratory distress.     Breath sounds: Normal breath sounds.  Musculoskeletal:     Cervical back: Neck supple.  Skin:    General: Skin is warm and dry.  Neurological:     Mental Status: She is alert.  Psychiatric:        Mood and Affect: Mood normal.        Behavior: Behavior normal.     UC Treatments /  Results  Labs (all labs ordered are listed, but only abnormal results are displayed) Labs Reviewed - No data to display  EKG   Radiology No results  found.  Procedures Procedures (including critical care time)  Medications Ordered in UC Medications - No data to display  Initial Impression / Assessment and Plan / UC Course  I have reviewed the triage vital signs and the nursing notes.  Pertinent labs & imaging results that were available during my care of the patient were reviewed by me and considered in my medical decision making (see chart for details).   Acute sinusitis.  Patient has been symptomatic for a week and is not improving with OTC treatment.  Treating with amoxicillin.  Discussed Tylenol or ibuprofen as needed.  Instructed patient to follow-up with her PCP if her symptoms are not improving.  She agrees to plan of care.   Final Clinical Impressions(s) / UC Diagnoses   Final diagnoses:  Acute non-recurrent maxillary sinusitis     Discharge Instructions      Take the amoxicillin as directed.  Follow up with your primary care provider if your symptoms are not improving.         ED Prescriptions     Medication Sig Dispense Auth. Provider   amoxicillin (AMOXIL) 875 MG tablet Take 1 tablet (875 mg total) by mouth 2 (two) times daily for 10 days. 20 tablet Sharion Balloon, NP      PDMP not reviewed this encounter.   Sharion Balloon, NP 07/26/21 [phone]

## 2023-12-22 ENCOUNTER — Ambulatory Visit: Payer: Self-pay | Admitting: Podiatry

## 2023-12-22 ENCOUNTER — Encounter: Payer: Self-pay | Admitting: Podiatry

## 2023-12-22 VITALS — Ht 64.0 in | Wt 136.9 lb

## 2023-12-22 DIAGNOSIS — B351 Tinea unguium: Secondary | ICD-10-CM | POA: Diagnosis not present

## 2023-12-22 DIAGNOSIS — B353 Tinea pedis: Secondary | ICD-10-CM | POA: Diagnosis not present

## 2023-12-22 MED ORDER — CLOTRIMAZOLE-BETAMETHASONE 1-0.05 % EX CREA: 1.0000 | TOPICAL_CREAM | Freq: Every day | CUTANEOUS | 2 refills | Status: AC

## 2023-12-22 NOTE — Progress Notes (Signed)
   Chief Complaint  Patient presents with   Nail Problem    Pt is here due to bilateral toe nail fungus and maybe athletes foot, she states that she has a discoloration to toenails, with some itching and redness between the toes, she says its not as bad now as it was while she was pregnant, wants to know what can be done to help. Pt is breast feeding.    Subjective: 33 y.o. female presenting today as a new patient for evaluation of toenail fungus bilateral.  She states that since her second child born February 2025 the toenails have become significantly worse.  Presenting for further treatment evaluation  Past Medical History:  Diagnosis Date   Anxiety    Asthma    age teens to age 89   Bicuspid aortic valve    Complex ovarian cyst    Depression     Past Surgical History:  Procedure Laterality Date   APPENDECTOMY  2007   CESAREAN SECTION  06/08/2021   Procedure: CESAREAN SECTION;  Surgeon: Verdon Keen, MD;  Location: [facility];  Service: Obstetrics;;   OVARIAN CYST REMOVAL Bilateral 12/17/2020   Procedure: Open OVARIAN CYSTECTOMY with pelvic washings;  Surgeon: Verdon Keen, MD;  Location: [facility];  Service: Gynecology;  Laterality: Bilateral;    Allergies  Allergen Reactions   Banana     Abdominal Pain    B/L toes 12/22/2023  Objective: Physical Exam General: The patient is alert and oriented x3 in no acute distress.  Dermatology: Hyperkeratotic, discolored, thickened, onychodystrophy noted. Skin is warm, dry and supple bilateral lower extremities. Negative for open lesions or macerations.  Dry peeling skin with pruritus noted to the forefoot bilateral consistent with chronic tinea pedis  Vascular: Palpable pedal pulses bilaterally. No edema or erythema noted. Capillary refill within normal limits.  Neurological: Grossly intact via light touch  Musculoskeletal Exam: No pedal deformity noted  Assessment: #1 Onychomycosis of toenails bilateral #2 chronic tinea  pedis bilateral  Plan of Care:  -Patient was evaluated. -Today we discussed different treatment options including oral, topical, and laser antifungal treatment modalities.  We discussed their efficacies and side effects.  Currently the patient is breast-feeding.  Will defer any oral antifungal medication for the moment.  Patient anticipates breast-feeding through February 2026.  She will return to the office at that time to reconsider oral Lamisil.  LFT 12/14/2023 WNL -Prescription for Lotrisone cream apply twice daily -OTC topical Tolcylen antifungal dispensed at checkout to apply daily to the nails -Return to clinic February 2026   Thresa EMERSON Sar, DPM [facility]  Dr. Thresa EMERSON Sar, DPM    [address] [place], [place] [address]                Office [phone]  Fax [phone]

## 2024-01-26 ENCOUNTER — Emergency Department
Admission: EM | Admit: 2024-01-26 | Discharge: 2024-01-26 | Disposition: A | Discharge status: Home / Self Care | Attending: Emergency Medicine | Admitting: Emergency Medicine

## 2024-01-26 ENCOUNTER — Other Ambulatory Visit: Payer: Self-pay

## 2024-01-26 ENCOUNTER — Emergency Department

## 2024-01-26 DIAGNOSIS — R109 Unspecified abdominal pain: Secondary | ICD-10-CM | POA: Diagnosis present

## 2024-01-26 DIAGNOSIS — N201 Calculus of ureter: Secondary | ICD-10-CM

## 2024-01-26 DIAGNOSIS — N132 Hydronephrosis with renal and ureteral calculous obstruction: Secondary | ICD-10-CM | POA: Insufficient documentation

## 2024-01-26 DIAGNOSIS — N23 Unspecified renal colic: Secondary | ICD-10-CM | POA: Diagnosis not present

## 2024-01-26 LAB — URINALYSIS, ROUTINE W REFLEX MICROSCOPIC
Bilirubin Urine: NEGATIVE
Glucose, UA: NEGATIVE mg/dL
Hgb urine dipstick: NEGATIVE
Ketones, ur: 5 mg/dL — AB
Leukocytes,Ua: NEGATIVE
Nitrite: NEGATIVE
Protein, ur: 30 mg/dL — AB
Specific Gravity, Urine: 1.036 — ABNORMAL HIGH (ref 1.005–1.030)
pH: 5 (ref 5.0–8.0)

## 2024-01-26 LAB — CBC
HCT: 35.1 % — ABNORMAL LOW (ref 36.0–46.0)
Hemoglobin: 11.5 g/dL — ABNORMAL LOW (ref 12.0–15.0)
MCH: 30.4 pg (ref 26.0–34.0)
MCHC: 32.8 g/dL (ref 30.0–36.0)
MCV: 92.9 fL (ref 80.0–100.0)
Platelets: 200 K/uL (ref 150–400)
RBC: 3.78 MIL/uL — ABNORMAL LOW (ref 3.87–5.11)
RDW: 13 % (ref 11.5–15.5)
WBC: 8.5 K/uL (ref 4.0–10.5)
nRBC: 0 % (ref 0.0–0.2)

## 2024-01-26 LAB — COMPREHENSIVE METABOLIC PANEL WITH GFR
ALT: 15 U/L (ref 0–44)
AST: 20 U/L (ref 15–41)
Albumin: 3.9 g/dL (ref 3.5–5.0)
Alkaline Phosphatase: 41 U/L (ref 38–126)
Anion gap: 6 (ref 5–15)
BUN: 24 mg/dL — ABNORMAL HIGH (ref 6–20)
CO2: 27 mmol/L (ref 22–32)
Calcium: 8.8 mg/dL — ABNORMAL LOW (ref 8.9–10.3)
Chloride: 101 mmol/L (ref 98–111)
Creatinine, Ser: 1.03 mg/dL — ABNORMAL HIGH (ref 0.44–1.00)
GFR, Estimated: >60 mL/min (ref >60)
Glucose, Bld: 107 mg/dL — ABNORMAL HIGH (ref 70–99)
Potassium: 4 mmol/L (ref 3.5–5.1)
Sodium: 134 mmol/L — ABNORMAL LOW (ref 135–145)
Total Bilirubin: 0.3 mg/dL (ref 0.0–1.2)
Total Protein: 6.7 g/dL (ref 6.5–8.1)

## 2024-01-26 LAB — POC URINE PREG, ED: Preg Test, Ur: NEGATIVE

## 2024-01-26 LAB — LIPASE, BLOOD: Lipase: 36 U/L (ref 11–51)

## 2024-01-26 MED ORDER — ONDANSETRON HCL 4 MG/2ML IJ SOLN
4.0000 mg | INTRAMUSCULAR | Status: AC
  Administered 2024-01-26: 4 mg via INTRAVENOUS
  Filled 2024-01-26: qty 2

## 2024-01-26 MED ORDER — OXYCODONE-ACETAMINOPHEN 5-325 MG PO TABS: 2.0000 | ORAL_TABLET | Freq: Three times a day (TID) | ORAL | 0 refills | Status: AC | PRN

## 2024-01-26 MED ORDER — SODIUM CHLORIDE 0.9 % IV BOLUS
500.0000 mL | Freq: Once | INTRAVENOUS | Status: AC
  Administered 2024-01-26: 500 mL via INTRAVENOUS

## 2024-01-26 MED ORDER — MORPHINE SULFATE (PF) 4 MG/ML IV SOLN
4.0000 mg | Freq: Once | INTRAVENOUS | Status: AC
  Administered 2024-01-26: 4 mg via INTRAVENOUS
  Filled 2024-01-26: qty 1

## 2024-01-26 MED ORDER — KETOROLAC TROMETHAMINE 30 MG/ML IJ SOLN
15.0000 mg | Freq: Once | INTRAMUSCULAR | Status: AC
  Administered 2024-01-26: 15 mg via INTRAVENOUS
  Filled 2024-01-26: qty 1

## 2024-01-26 NOTE — Discharge Instructions (Addendum)
 You have been seen in the Emergency Department (ED) today for pain caused by kidney stones.  As we have discussed, please drink plenty of fluids.  Please make a follow up appointment with the physician(s) listed elsewhere in this documentation.  You may take pain medication as needed but ONLY as prescribed.  If you are going to follow up with Urology for possible lithotripsy, please do not take any ibuprofen , naproxen, aspirin, Toradol , or other NSAID after Tuesday morning, as this may exclude you from lithotripsy.  Please see your doctor as soon as possible as stones may take 1-3 weeks to pass and you may require additional care or medications.  Do not drink alcohol, drive or participate in any other potentially dangerous activities while taking opiate pain medication as it may make you sleepy. Do not take this medication with any other sedating medications, either prescription or over-the-counter. If you were prescribed Percocet or Vicodin, do not take these with acetaminophen  (Tylenol ) as it is already contained within these medications.   Take Percocet as needed for severe pain.  This medication is an opiate (or narcotic) pain medication and can be habit forming.  Use it as little as possible to achieve adequate pain control.  Do not use or use it with extreme caution if you have a history of opiate abuse or dependence.  If you are on a pain contract with your primary care doctor or a pain specialist, be sure to let them know you were prescribed this medication today from the [facility].  This medication is intended for your use only - do not give any to anyone else and keep it in a secure place where nobody else, especially children, have access to it.  It will also cause or worsen constipation, so you may want to consider taking an over-the-counter stool softener while you are taking this medication.  Return to the Emergency Department (ED) or call your doctor if you have  any worsening pain, fever, painful urination, are unable to urinate, or develop other symptoms that concern you.

## 2024-01-26 NOTE — ED Provider Notes (Signed)
 [facility] Provider Note    Event Date/Time   First MD Initiated Contact with Patient 01/26/24 [phone]     (approximate)   History   Flank Pain and Abdominal Pain   HPI Carolyn Munoz is a 33 y.o. female who presents for evaluation of acute onset sharp pain in her left flank.  She says she had some milder symptoms earlier in the day or maybe even over the last several days but starting this morning around 10 AM she started having more severe pain.  It has been intermittent all day but got much worse in the evening to the point that she was also having nausea and vomiting.  It waxes and wanes but never goes away completely.  She cannot find a position of comfort and feels like she has to keep moving around and bending in different positions.  She is currently breast-feeding a 79-month-old.  The patient does not believe that she is ever had kidney stones before.  She has had no burning when she urinates and has not noticed any blood in her urine.     Physical Exam   Triage Vital Signs: ED Triage Vitals  Encounter Vitals Group     BP 01/26/24 0132 107/75     Girls Systolic BP Percentile --      Girls Diastolic BP Percentile --      Boys Systolic BP Percentile --      Boys Diastolic BP Percentile --      Pulse Rate 01/26/24 0132 90     Resp 01/26/24 0132 20     Temp 01/26/24 0132 98.6 F (37 C)     Temp Source 01/26/24 0132 Oral     SpO2 01/26/24 0132 99 %     Weight 01/26/24 0133 68.5 kg (151 lb)     Height 01/26/24 0133 1.524 m (5')     Head Circumference --      Peak Flow --      Pain Score 01/26/24 0133 6     Pain Loc --      Pain Education --      Exclude from Growth Chart --     Most recent vital signs: Vitals:   01/26/24 0515 01/26/24 0558  BP:  139/72  Pulse: 70 60  Resp:  18  Temp:  98.7 F (37.1 C)  SpO2: 100% 100%    General: Awake, alert, appears very uncomfortable and is semistanding, semikneeling at the side of her bed CV:  Good  peripheral perfusion.  Regular rate and rhythm. Resp:  Normal effort. Speaking easily and comfortably, no accessory muscle usage nor intercostal retractions.   Abd:  No distention.  No tenderness to palpation of the abdomen.  Left-sided flank tenderness to percussion.   ED Results / Procedures / Treatments   Labs (all labs ordered are listed, but only abnormal results are displayed) Labs Reviewed  COMPREHENSIVE METABOLIC PANEL WITH GFR - Abnormal; Notable for the following components:      Result Value   Sodium 134 (*)    Glucose, Bld 107 (*)    BUN 24 (*)    Creatinine, Ser 1.03 (*)    Calcium 8.8 (*)    All other components within normal limits  CBC - Abnormal; Notable for the following components:   RBC 3.78 (*)    Hemoglobin 11.5 (*)    HCT 35.1 (*)    All other components within normal limits  URINALYSIS, ROUTINE W REFLEX MICROSCOPIC -  Abnormal; Notable for the following components:   Color, Urine YELLOW (*)    APPearance HAZY (*)    Specific Gravity, Urine 1.036 (*)    Ketones, ur 5 (*)    Protein, ur 30 (*)    Bacteria, UA RARE (*)    All other components within normal limits  LIPASE, BLOOD  POC URINE PREG, ED      RADIOLOGY I independently viewed and interpreted the patient's CT of the abdomen and pelvis and it appears that she has left-sided hydronephrosis with a stone on the left side.  Radiology confirmed a 3 mm proximal ureter stone and hydronephrosis.   PROCEDURES:  Critical Care performed: No  Procedures    IMPRESSION / MDM / ASSESSMENT AND PLAN / ED COURSE  I reviewed the triage vital signs and the nursing notes.                              Differential diagnosis includes, but is not limited to, renal/ureteral colic, UTI/pyelonephritis, less likely PE.  Patient's presentation is most consistent with acute presentation with potential threat to life or bodily function.  Labs/studies ordered: CT ureteral stone study, CBC, lipase, urinalysis,  CMP, urine pregnancy test  Interventions/Medications given:  Medications  ketorolac  (TORADOL ) 30 MG/ML injection 15 mg (15 mg Intravenous Given 01/26/24 0450)  morphine  (PF) 4 MG/ML injection 4 mg (4 mg Intravenous Given 01/26/24 0451)  ondansetron  (ZOFRAN ) injection 4 mg (4 mg Intravenous Given 01/26/24 0450)  sodium chloride  0.9 % bolus 500 mL (500 mLs Intravenous New Bag/Given 01/26/24 0451)    (Note:  hospital course my include additional interventions and/or labs/studies not listed above.)   Vital signs stable, no systemic signs of infection.  Mild AKI, I ordered a 500 mL normal saline bolus.  Patient has some squamous cells in her urine but no obvious hematuria or infection.  3 mm left proximal ureteral stone with hydronephrosis is very consistent with her symptoms, no additional concern for PE at this time.  Discussed risks and benefits of IV medication given that she has breast-feeding, but Toradol  and morphine  while breast-feeding a 61-month-old should be considered relatively safe, and she can pump and discard the milk if she would like to do so.  She would like to proceed with treatment which I think is very reasonable and appropriate.  Medications as listed above.   Clinical Course as of 01/26/24 9378  [place] Jan 26, 2024  0616 I reassessed the patient and she is feeling much better and feels ready to go home.  I think that is reasonable and appropriate.  I had my usual kidney stone discussion with the patient with special emphasis on breast-feeding precautions and the patient will follow-up as an outpatient with urology. [CF]  0620 Of note, given the limited efficacy of Flomax and minimal data of its effects during breast-feeding and on the breast-feeding child, I will not prescribe it at this time. [CF]    Clinical Course User Index [CF] Gordan Huxley, MD     FINAL CLINICAL IMPRESSION(S) / ED DIAGNOSES   Final diagnoses:  Left ureteral stone  Ureteral colic     Rx / DC  Orders   ED Discharge Orders          Ordered    oxyCODONE -acetaminophen  (PERCOCET) 5-325 MG tablet  Every 8 hours PRN        01/26/24 9380  Note:  This document was prepared using Dragon voice recognition software and may include unintentional dictation errors.   Gordan Huxley, MD 01/26/24 [phone]

## 2024-01-26 NOTE — ED Triage Notes (Signed)
 Patient ambulatory to triage with complaints of left flank and abdominal pain. Patient was seen at walk in clinic earlier today, had labs and told she most likely had a kidney stone, told to go to ER if pain is severe. Denies previous hx of stones. Patient states when the pain is severe she is nauseous. Patient is currently breastfeeding.

## 2024-01-29 DIAGNOSIS — N200 Calculus of kidney: Secondary | ICD-10-CM | POA: Insufficient documentation

## 2024-01-29 NOTE — Assessment & Plan Note (Signed)
 3 mm Left obstructive proximal ureteral stone (dx 01/26/24) Mild left hydronephrosis First lifetime stone, low risk history -possible stone formation during recent pregnancy  She passed her stones a couple days ago.  She brought with her today Asymptomatic  Today I reviewed the basic tenets of kidney stone prevention including increased hydration up to 2 to 2-1/2 L/day, reduction in dietary sodium, increase dietary citrate (lemonade therapy), moderation of oxalate rich food groups, and moderation of dietary animal protein.  As this is her first lifetime stone event she does not require any further workup or surveillance at this time.  Will send stone for analysis to have on file. Follow-up as needed

## 2024-01-29 NOTE — Progress Notes (Signed)
   02/02/24 11:25 AM   Carolyn Munoz 06/03/1991 [id number]  CC: kidney stone   HPI: Initial evaluation today for new diagnosis kidney stone  Seen in ED on 01/26/24, Left flank pain  -3 mm left proximal ureteral stone Prescribed MET -she spontaneously passed stones couple days ago, brought with her today in clinic Asymptomatic today First lifetime stone event No family history of stones No known medical issues, or high risk lithogenic factors   Currently breastfeeding a 34 mo old  PMH: Past Medical History:  Diagnosis Date   Anxiety    Asthma    age teens to age 54   Bicuspid aortic valve    Complex ovarian cyst    Depression     Surgical History: Past Surgical History:  Procedure Laterality Date   APPENDECTOMY  2007   CESAREAN SECTION  06/08/2021   Procedure: CESAREAN SECTION;  Surgeon: Verdon Keen, MD;  Location: [facility] ORS;  Service: Obstetrics;;   OVARIAN CYST REMOVAL Bilateral 12/17/2020   Procedure: Open OVARIAN CYSTECTOMY with pelvic washings;  Surgeon: Verdon Keen, MD;  Location: [facility] ORS;  Service: Gynecology;  Laterality: Bilateral;    Family History: Family History  Problem Relation Age of Onset   Heart disease Mother    Breast cancer Mother     Social History:  reports that she has never smoked. She has never used smokeless tobacco. She reports that she does not currently use alcohol. She reports that she does not use drugs.  Physical Exam: BP 112/77 (BP Location: Left Arm, Patient Position: Sitting, Cuff Size: Normal)   Pulse 89   Ht 4' 11 (1.499 m)   Wt 147 lb 12.8 oz (67 kg)   LMP 01/04/2024 (Exact Date)   BMI 29.85 kg/m    Constitutional:  Alert and oriented, No acute distress. Cardiovascular: No clubbing, cyanosis, or edema. Respiratory: Normal respiratory effort, no increased work of breathing. GI: Nondistended Skin: No rashes, bruises or suspicious lesions. Neurologic: Grossly intact, no focal deficits, moving all 4  extremities. Psychiatric: Normal mood and affect.  Laboratory Data:  Latest Reference Range & Units 01/26/24 01:36  Creatinine 0.44 - 1.00 mg/dL 8.96 (H)  (H): Data is abnormally high  UA 01/26/24 - negative  Pertinent Imaging: I have personally viewed and interpreted the CT Stone (01/26/24)-agree with read, 3 mm left proximal ureteral stone with mild proximal hydronephrosis noted.  No other nephrolithiasis, morphologically normal bilateral kidneys and bladder.   Assessment & Plan:    Left nephrolithiasis Assessment & Plan: 3 mm Left obstructive proximal ureteral stone (dx 01/26/24) Mild left hydronephrosis First lifetime stone, low risk history -possible stone formation during recent pregnancy  She passed her stones a couple days ago.  She brought with her today Asymptomatic  Today I reviewed the basic tenets of kidney stone prevention including increased hydration up to 2 to 2-1/2 L/day, reduction in dietary sodium, increase dietary citrate (lemonade therapy), moderation of oxalate rich food groups, and moderation of dietary animal protein.  As this is her first lifetime stone event she does not require any further workup or surveillance at this time.  Will send stone for analysis to have on file. Follow-up as needed        Penne Skye, MD 02/02/2024  [facility] [address] [place], [place] [address] [phone]

## 2024-02-02 ENCOUNTER — Ambulatory Visit (INDEPENDENT_AMBULATORY_CARE_PROVIDER_SITE_OTHER): Payer: PRIVATE HEALTH INSURANCE | Admitting: Urology

## 2024-02-02 ENCOUNTER — Encounter: Payer: Self-pay | Admitting: Urology

## 2024-02-02 VITALS — BP 112/77 | HR 89 | Ht 59.0 in | Wt 147.8 lb

## 2024-02-02 DIAGNOSIS — N200 Calculus of kidney: Secondary | ICD-10-CM

## 2024-02-02 NOTE — Patient Instructions (Signed)

## 2024-02-15 LAB — STONE ANALYSIS
Calcium Oxalate Dihydrate: 10 %
Calcium Oxalate Monohydrate: 80 %
Calcium Phosphate (Hydroxyl): 10 %
Weight Calculi: 44 mg

## 2024-07-30 ENCOUNTER — Ambulatory Visit: Admitting: Podiatry
